# Patient Record
Sex: Female | Born: 1945
Health system: Southern US, Community
[De-identification: ages and names within clinical notes are randomized; demographics above are authoritative.]

## PROBLEM LIST (undated history)

## (undated) DIAGNOSIS — Z95810 Presence of automatic (implantable) cardiac defibrillator: Secondary | ICD-10-CM

## (undated) DIAGNOSIS — I219 Acute myocardial infarction, unspecified: Secondary | ICD-10-CM

## (undated) DIAGNOSIS — I2510 Atherosclerotic heart disease of native coronary artery without angina pectoris: Secondary | ICD-10-CM

## (undated) DIAGNOSIS — Z950 Presence of cardiac pacemaker: Secondary | ICD-10-CM

## (undated) DIAGNOSIS — I10 Essential (primary) hypertension: Secondary | ICD-10-CM

## (undated) DIAGNOSIS — E7800 Pure hypercholesterolemia, unspecified: Secondary | ICD-10-CM

## (undated) HISTORY — PX: KIDNEY STONE SURGERY: SHX686

## (undated) HISTORY — PX: ABDOMINAL HYSTERECTOMY: SHX81

## (undated) HISTORY — PX: TUBAL LIGATION: SHX77

## (undated) HISTORY — PX: CHOLECYSTECTOMY: SHX55

## (undated) HISTORY — PX: OTHER SURGICAL HISTORY: SHX169

---

## 2005-05-01 ENCOUNTER — Ambulatory Visit: Payer: Self-pay | Admitting: Family Medicine

## 2006-07-15 ENCOUNTER — Ambulatory Visit: Payer: Self-pay | Admitting: Specialist

## 2007-06-09 ENCOUNTER — Ambulatory Visit: Payer: Self-pay | Admitting: Physician Assistant

## 2007-06-14 ENCOUNTER — Ambulatory Visit: Payer: Self-pay | Admitting: Gastroenterology

## 2007-06-15 ENCOUNTER — Ambulatory Visit: Payer: Self-pay | Admitting: Family Medicine

## 2007-11-30 ENCOUNTER — Ambulatory Visit: Payer: Self-pay | Admitting: Physician Assistant

## 2007-12-06 ENCOUNTER — Ambulatory Visit: Payer: Self-pay | Admitting: General Surgery

## 2008-06-12 ENCOUNTER — Ambulatory Visit: Payer: Self-pay | Admitting: General Surgery

## 2009-06-26 ENCOUNTER — Ambulatory Visit: Payer: Self-pay | Admitting: General Surgery

## 2009-12-04 ENCOUNTER — Ambulatory Visit: Payer: Self-pay | Admitting: Physician Assistant

## 2010-01-10 ENCOUNTER — Ambulatory Visit: Payer: Self-pay | Admitting: General Surgery

## 2010-07-10 ENCOUNTER — Ambulatory Visit: Payer: Self-pay | Admitting: General Surgery

## 2012-04-15 ENCOUNTER — Ambulatory Visit: Payer: Self-pay | Admitting: Physician Assistant

## 2012-04-19 ENCOUNTER — Ambulatory Visit: Payer: Self-pay | Admitting: Physician Assistant

## 2013-11-30 ENCOUNTER — Ambulatory Visit: Payer: Self-pay | Admitting: Physician Assistant

## 2014-10-19 ENCOUNTER — Other Ambulatory Visit: Payer: Self-pay | Admitting: Physician Assistant

## 2014-10-19 DIAGNOSIS — M8580 Other specified disorders of bone density and structure, unspecified site: Secondary | ICD-10-CM

## 2015-05-01 ENCOUNTER — Other Ambulatory Visit: Payer: Self-pay | Admitting: Physician Assistant

## 2015-05-01 DIAGNOSIS — Z1239 Encounter for other screening for malignant neoplasm of breast: Secondary | ICD-10-CM

## 2015-05-08 DIAGNOSIS — E876 Hypokalemia: Secondary | ICD-10-CM | POA: Insufficient documentation

## 2015-05-08 DIAGNOSIS — I2510 Atherosclerotic heart disease of native coronary artery without angina pectoris: Secondary | ICD-10-CM | POA: Insufficient documentation

## 2015-05-08 DIAGNOSIS — Z95810 Presence of automatic (implantable) cardiac defibrillator: Secondary | ICD-10-CM | POA: Insufficient documentation

## 2015-05-08 DIAGNOSIS — Z8674 Personal history of sudden cardiac arrest: Secondary | ICD-10-CM | POA: Insufficient documentation

## 2015-10-30 ENCOUNTER — Other Ambulatory Visit: Payer: Self-pay | Admitting: Physician Assistant

## 2015-10-30 DIAGNOSIS — E7800 Pure hypercholesterolemia, unspecified: Secondary | ICD-10-CM | POA: Diagnosis not present

## 2015-10-30 DIAGNOSIS — E119 Type 2 diabetes mellitus without complications: Secondary | ICD-10-CM | POA: Diagnosis not present

## 2015-10-30 DIAGNOSIS — I2510 Atherosclerotic heart disease of native coronary artery without angina pectoris: Secondary | ICD-10-CM | POA: Diagnosis not present

## 2015-10-30 DIAGNOSIS — Z1231 Encounter for screening mammogram for malignant neoplasm of breast: Secondary | ICD-10-CM

## 2015-10-30 DIAGNOSIS — I10 Essential (primary) hypertension: Secondary | ICD-10-CM | POA: Diagnosis not present

## 2015-11-09 ENCOUNTER — Ambulatory Visit: Payer: Self-pay

## 2015-11-20 DIAGNOSIS — E785 Hyperlipidemia, unspecified: Secondary | ICD-10-CM | POA: Diagnosis not present

## 2015-11-20 DIAGNOSIS — Z95810 Presence of automatic (implantable) cardiac defibrillator: Secondary | ICD-10-CM | POA: Diagnosis not present

## 2015-11-20 DIAGNOSIS — Z8674 Personal history of sudden cardiac arrest: Secondary | ICD-10-CM | POA: Diagnosis not present

## 2015-11-20 DIAGNOSIS — I2510 Atherosclerotic heart disease of native coronary artery without angina pectoris: Secondary | ICD-10-CM | POA: Diagnosis not present

## 2015-11-23 ENCOUNTER — Ambulatory Visit
Admission: RE | Admit: 2015-11-23 | Discharge: 2015-11-23 | Disposition: A | Discharge status: Home / Self Care | Payer: BLUE CROSS/BLUE SHIELD | Source: Ambulatory Visit | Attending: Physician Assistant | Admitting: Physician Assistant

## 2015-11-23 DIAGNOSIS — Z1231 Encounter for screening mammogram for malignant neoplasm of breast: Secondary | ICD-10-CM | POA: Diagnosis not present

## 2015-11-26 DIAGNOSIS — Z8674 Personal history of sudden cardiac arrest: Secondary | ICD-10-CM | POA: Diagnosis not present

## 2015-12-24 DIAGNOSIS — N390 Urinary tract infection, site not specified: Secondary | ICD-10-CM | POA: Diagnosis not present

## 2016-01-11 DIAGNOSIS — N390 Urinary tract infection, site not specified: Secondary | ICD-10-CM | POA: Diagnosis not present

## 2016-01-11 DIAGNOSIS — Z6831 Body mass index (BMI) 31.0-31.9, adult: Secondary | ICD-10-CM | POA: Diagnosis not present

## 2016-01-28 DIAGNOSIS — E669 Obesity, unspecified: Secondary | ICD-10-CM | POA: Diagnosis not present

## 2016-01-28 DIAGNOSIS — Z6831 Body mass index (BMI) 31.0-31.9, adult: Secondary | ICD-10-CM | POA: Diagnosis not present

## 2016-01-28 DIAGNOSIS — N390 Urinary tract infection, site not specified: Secondary | ICD-10-CM | POA: Diagnosis not present

## 2016-02-11 DIAGNOSIS — R300 Dysuria: Secondary | ICD-10-CM | POA: Diagnosis not present

## 2016-02-22 DIAGNOSIS — Z8674 Personal history of sudden cardiac arrest: Secondary | ICD-10-CM | POA: Diagnosis not present

## 2016-02-22 DIAGNOSIS — E663 Overweight: Secondary | ICD-10-CM | POA: Insufficient documentation

## 2016-02-22 DIAGNOSIS — E785 Hyperlipidemia, unspecified: Secondary | ICD-10-CM | POA: Diagnosis not present

## 2016-02-22 DIAGNOSIS — Z95810 Presence of automatic (implantable) cardiac defibrillator: Secondary | ICD-10-CM | POA: Diagnosis not present

## 2016-02-22 DIAGNOSIS — I2510 Atherosclerotic heart disease of native coronary artery without angina pectoris: Secondary | ICD-10-CM | POA: Diagnosis not present

## 2016-02-25 DIAGNOSIS — Z95810 Presence of automatic (implantable) cardiac defibrillator: Secondary | ICD-10-CM | POA: Diagnosis not present

## 2016-02-25 DIAGNOSIS — Z4502 Encounter for adjustment and management of automatic implantable cardiac defibrillator: Secondary | ICD-10-CM | POA: Diagnosis not present

## 2016-05-06 DIAGNOSIS — Z23 Encounter for immunization: Secondary | ICD-10-CM | POA: Diagnosis not present

## 2016-05-06 DIAGNOSIS — I2510 Atherosclerotic heart disease of native coronary artery without angina pectoris: Secondary | ICD-10-CM | POA: Diagnosis not present

## 2016-05-06 DIAGNOSIS — Z0000 Encounter for general adult medical examination without abnormal findings: Secondary | ICD-10-CM | POA: Diagnosis not present

## 2016-05-06 DIAGNOSIS — Z9181 History of falling: Secondary | ICD-10-CM | POA: Diagnosis not present

## 2016-05-06 DIAGNOSIS — I472 Ventricular tachycardia: Secondary | ICD-10-CM | POA: Diagnosis not present

## 2016-05-06 DIAGNOSIS — E119 Type 2 diabetes mellitus without complications: Secondary | ICD-10-CM | POA: Diagnosis not present

## 2016-05-06 DIAGNOSIS — Z1389 Encounter for screening for other disorder: Secondary | ICD-10-CM | POA: Diagnosis not present

## 2016-06-04 DIAGNOSIS — Z8674 Personal history of sudden cardiac arrest: Secondary | ICD-10-CM | POA: Diagnosis not present

## 2016-06-27 ENCOUNTER — Other Ambulatory Visit: Payer: Self-pay | Admitting: Gastroenterology

## 2016-06-27 DIAGNOSIS — R1310 Dysphagia, unspecified: Secondary | ICD-10-CM

## 2016-06-27 DIAGNOSIS — Z86010 Personal history of colonic polyps: Secondary | ICD-10-CM | POA: Diagnosis not present

## 2016-06-27 DIAGNOSIS — I10 Essential (primary) hypertension: Secondary | ICD-10-CM | POA: Diagnosis not present

## 2016-06-27 DIAGNOSIS — K219 Gastro-esophageal reflux disease without esophagitis: Secondary | ICD-10-CM | POA: Diagnosis not present

## 2016-07-02 ENCOUNTER — Ambulatory Visit
Admission: RE | Admit: 2016-07-02 | Discharge: 2016-07-02 | Disposition: A | Discharge status: Home / Self Care | Payer: BLUE CROSS/BLUE SHIELD | Source: Ambulatory Visit | Attending: Gastroenterology | Admitting: Gastroenterology

## 2016-07-02 DIAGNOSIS — K228 Other specified diseases of esophagus: Secondary | ICD-10-CM | POA: Insufficient documentation

## 2016-07-02 DIAGNOSIS — K449 Diaphragmatic hernia without obstruction or gangrene: Secondary | ICD-10-CM | POA: Diagnosis not present

## 2016-07-02 DIAGNOSIS — K219 Gastro-esophageal reflux disease without esophagitis: Secondary | ICD-10-CM | POA: Insufficient documentation

## 2016-07-02 DIAGNOSIS — R1310 Dysphagia, unspecified: Secondary | ICD-10-CM | POA: Diagnosis not present

## 2016-07-11 DIAGNOSIS — I10 Essential (primary) hypertension: Secondary | ICD-10-CM | POA: Diagnosis not present

## 2016-07-23 DIAGNOSIS — I10 Essential (primary) hypertension: Secondary | ICD-10-CM | POA: Diagnosis not present

## 2016-07-23 DIAGNOSIS — Z6830 Body mass index (BMI) 30.0-30.9, adult: Secondary | ICD-10-CM | POA: Diagnosis not present

## 2016-07-23 DIAGNOSIS — Z9181 History of falling: Secondary | ICD-10-CM | POA: Diagnosis not present

## 2016-07-31 DIAGNOSIS — Z95810 Presence of automatic (implantable) cardiac defibrillator: Secondary | ICD-10-CM | POA: Diagnosis not present

## 2016-08-22 DIAGNOSIS — Z95810 Presence of automatic (implantable) cardiac defibrillator: Secondary | ICD-10-CM | POA: Diagnosis not present

## 2016-08-22 DIAGNOSIS — E785 Hyperlipidemia, unspecified: Secondary | ICD-10-CM | POA: Diagnosis not present

## 2016-08-22 DIAGNOSIS — I2510 Atherosclerotic heart disease of native coronary artery without angina pectoris: Secondary | ICD-10-CM | POA: Diagnosis not present

## 2016-08-22 DIAGNOSIS — Z8674 Personal history of sudden cardiac arrest: Secondary | ICD-10-CM | POA: Diagnosis not present

## 2016-10-13 ENCOUNTER — Encounter: Payer: Self-pay | Admitting: *Deleted

## 2016-10-14 ENCOUNTER — Ambulatory Visit
Admission: RE | Admit: 2016-10-14 | Discharge: 2016-10-14 | Disposition: A | Discharge status: Home / Self Care | Payer: BLUE CROSS/BLUE SHIELD | Source: Ambulatory Visit | Attending: Gastroenterology | Admitting: Gastroenterology

## 2016-10-14 ENCOUNTER — Ambulatory Visit: Payer: BLUE CROSS/BLUE SHIELD | Admitting: Anesthesiology

## 2016-10-14 ENCOUNTER — Encounter
Admission: RE | Disposition: A | Discharge status: Home / Self Care | Payer: Self-pay | Source: Ambulatory Visit | Attending: Gastroenterology

## 2016-10-14 DIAGNOSIS — D123 Benign neoplasm of transverse colon: Secondary | ICD-10-CM | POA: Insufficient documentation

## 2016-10-14 DIAGNOSIS — K259 Gastric ulcer, unspecified as acute or chronic, without hemorrhage or perforation: Secondary | ICD-10-CM | POA: Insufficient documentation

## 2016-10-14 DIAGNOSIS — K6389 Other specified diseases of intestine: Secondary | ICD-10-CM | POA: Insufficient documentation

## 2016-10-14 DIAGNOSIS — D122 Benign neoplasm of ascending colon: Secondary | ICD-10-CM | POA: Diagnosis not present

## 2016-10-14 DIAGNOSIS — K319 Disease of stomach and duodenum, unspecified: Secondary | ICD-10-CM | POA: Insufficient documentation

## 2016-10-14 DIAGNOSIS — K224 Dyskinesia of esophagus: Secondary | ICD-10-CM | POA: Diagnosis not present

## 2016-10-14 DIAGNOSIS — K644 Residual hemorrhoidal skin tags: Secondary | ICD-10-CM | POA: Diagnosis not present

## 2016-10-14 DIAGNOSIS — K529 Noninfective gastroenteritis and colitis, unspecified: Secondary | ICD-10-CM | POA: Diagnosis not present

## 2016-10-14 DIAGNOSIS — Z86010 Personal history of colonic polyps: Secondary | ICD-10-CM | POA: Diagnosis not present

## 2016-10-14 DIAGNOSIS — Z7982 Long term (current) use of aspirin: Secondary | ICD-10-CM | POA: Insufficient documentation

## 2016-10-14 DIAGNOSIS — K3189 Other diseases of stomach and duodenum: Secondary | ICD-10-CM | POA: Diagnosis not present

## 2016-10-14 DIAGNOSIS — I252 Old myocardial infarction: Secondary | ICD-10-CM | POA: Insufficient documentation

## 2016-10-14 DIAGNOSIS — K449 Diaphragmatic hernia without obstruction or gangrene: Secondary | ICD-10-CM | POA: Insufficient documentation

## 2016-10-14 DIAGNOSIS — K2950 Unspecified chronic gastritis without bleeding: Secondary | ICD-10-CM | POA: Diagnosis not present

## 2016-10-14 DIAGNOSIS — K2210 Ulcer of esophagus without bleeding: Secondary | ICD-10-CM | POA: Diagnosis not present

## 2016-10-14 DIAGNOSIS — I2510 Atherosclerotic heart disease of native coronary artery without angina pectoris: Secondary | ICD-10-CM | POA: Insufficient documentation

## 2016-10-14 DIAGNOSIS — Z79899 Other long term (current) drug therapy: Secondary | ICD-10-CM | POA: Diagnosis not present

## 2016-10-14 DIAGNOSIS — R1310 Dysphagia, unspecified: Secondary | ICD-10-CM | POA: Insufficient documentation

## 2016-10-14 DIAGNOSIS — E7800 Pure hypercholesterolemia, unspecified: Secondary | ICD-10-CM | POA: Diagnosis not present

## 2016-10-14 DIAGNOSIS — K210 Gastro-esophageal reflux disease with esophagitis: Secondary | ICD-10-CM | POA: Insufficient documentation

## 2016-10-14 DIAGNOSIS — I10 Essential (primary) hypertension: Secondary | ICD-10-CM | POA: Insufficient documentation

## 2016-10-14 DIAGNOSIS — K222 Esophageal obstruction: Secondary | ICD-10-CM | POA: Insufficient documentation

## 2016-10-14 DIAGNOSIS — D124 Benign neoplasm of descending colon: Secondary | ICD-10-CM | POA: Insufficient documentation

## 2016-10-14 DIAGNOSIS — Z95810 Presence of automatic (implantable) cardiac defibrillator: Secondary | ICD-10-CM | POA: Diagnosis not present

## 2016-10-14 DIAGNOSIS — K2960 Other gastritis without bleeding: Secondary | ICD-10-CM | POA: Diagnosis not present

## 2016-10-14 DIAGNOSIS — K641 Second degree hemorrhoids: Secondary | ICD-10-CM | POA: Insufficient documentation

## 2016-10-14 DIAGNOSIS — K648 Other hemorrhoids: Secondary | ICD-10-CM | POA: Diagnosis not present

## 2016-10-14 HISTORY — DX: Presence of automatic (implantable) cardiac defibrillator: Z95.810

## 2016-10-14 HISTORY — DX: Presence of cardiac pacemaker: Z95.0

## 2016-10-14 HISTORY — DX: Acute myocardial infarction, unspecified: I21.9

## 2016-10-14 HISTORY — PX: COLONOSCOPY WITH PROPOFOL: SHX5780

## 2016-10-14 HISTORY — DX: Atherosclerotic heart disease of native coronary artery without angina pectoris: I25.10

## 2016-10-14 HISTORY — DX: Pure hypercholesterolemia, unspecified: E78.00

## 2016-10-14 HISTORY — DX: Essential (primary) hypertension: I10

## 2016-10-14 HISTORY — PX: ESOPHAGOGASTRODUODENOSCOPY (EGD) WITH PROPOFOL: SHX5813

## 2016-10-14 SURGERY — COLONOSCOPY WITH PROPOFOL
Anesthesia: General

## 2016-10-14 MED ORDER — PROPOFOL 500 MG/50ML IV EMUL
INTRAVENOUS | Status: DC | PRN
  Administered 2016-10-14: 150 ug/kg/min via INTRAVENOUS

## 2016-10-14 MED ORDER — PHENYLEPHRINE HCL 10 MG/ML IJ SOLN
INTRAMUSCULAR | Status: DC | PRN
  Administered 2016-10-14: 50 ug via INTRAVENOUS
  Administered 2016-10-14: 100 ug via INTRAVENOUS

## 2016-10-14 MED ORDER — MIDAZOLAM HCL 2 MG/2ML IJ SOLN
INTRAMUSCULAR | Status: AC
  Filled 2016-10-14: qty 2

## 2016-10-14 MED ORDER — FENTANYL CITRATE (PF) 100 MCG/2ML IJ SOLN
INTRAMUSCULAR | Status: AC
  Filled 2016-10-14: qty 2

## 2016-10-14 MED ORDER — LIDOCAINE HCL (CARDIAC) 20 MG/ML IV SOLN
INTRAVENOUS | Status: DC | PRN
  Administered 2016-10-14: 100 mg via INTRAVENOUS

## 2016-10-14 MED ORDER — MIDAZOLAM HCL 2 MG/2ML IJ SOLN
INTRAMUSCULAR | Status: DC | PRN
  Administered 2016-10-14: 0.5 mg via INTRAVENOUS

## 2016-10-14 MED ORDER — FENTANYL CITRATE (PF) 100 MCG/2ML IJ SOLN
INTRAMUSCULAR | Status: DC | PRN
  Administered 2016-10-14: 50 ug via INTRAVENOUS

## 2016-10-14 MED ORDER — PROPOFOL 10 MG/ML IV BOLUS
INTRAVENOUS | Status: DC | PRN
  Administered 2016-10-14: 40 mg via INTRAVENOUS
  Administered 2016-10-14 (×3): 20 mg via INTRAVENOUS
  Administered 2016-10-14: 10 mg via INTRAVENOUS
  Administered 2016-10-14: 20 mg via INTRAVENOUS

## 2016-10-14 MED ORDER — SODIUM CHLORIDE 0.9 % IV SOLN
INTRAVENOUS | Status: DC
  Administered 2016-10-14: 08:00:00 via INTRAVENOUS

## 2016-10-14 MED ORDER — SODIUM CHLORIDE 0.9 % IV SOLN: INTRAVENOUS | Status: DC

## 2016-10-14 MED ORDER — EPHEDRINE SULFATE 50 MG/ML IJ SOLN
INTRAMUSCULAR | Status: DC | PRN
  Administered 2016-10-14 (×2): 10 mg via INTRAVENOUS

## 2016-10-14 MED ORDER — PROPOFOL 10 MG/ML IV BOLUS
INTRAVENOUS | Status: AC
  Filled 2016-10-14: qty 20

## 2016-10-14 NOTE — Anesthesia Preprocedure Evaluation (Signed)
Anesthesia Evaluation  Patient identified by MRN, date of birth, ID band Patient awake    Reviewed: Allergy & Precautions, H&P , NPO status , Patient's Chart, lab work & pertinent test results, reviewed documented beta blocker date and time   Airway Mallampati: II   Neck ROM: full    Dental  (+) Poor Dentition   Pulmonary neg pulmonary ROS,    Pulmonary exam normal        Cardiovascular hypertension, + CAD  negative cardio ROS Normal cardiovascular exam Rhythm:regular Rate:Normal     Neuro/Psych negative neurological ROS  negative psych ROS   GI/Hepatic negative GI ROS, Neg liver ROS,   Endo/Other  negative endocrine ROS  Renal/GU negative Renal ROS  negative genitourinary   Musculoskeletal   Abdominal   Peds  Hematology negative hematology ROS (+)   Anesthesia Other Findings Past Medical History: No date: Coronary artery disease No date: Hypercholesteremia No date: Hypertension Past Surgical History: No date: ABDOMINAL HYSTERECTOMY No date: CHOLECYSTECTOMY No date: GOITER SURGERY No date: INSERTION PACEMAKER/DEFIB No date: KIDNEY STONE SURGERY   Reproductive/Obstetrics negative OB ROS                             Anesthesia Physical Anesthesia Plan  ASA: III  Anesthesia Plan: General   Post-op Pain Management:    Induction:   Airway Management Planned:   Additional Equipment:   Intra-op Plan:   Post-operative Plan:   Informed Consent: I have reviewed the patients History and Physical, chart, labs and discussed the procedure including the risks, benefits and alternatives for the proposed anesthesia with the patient or authorized representative who has indicated his/her understanding and acceptance.   Dental Advisory Given  Plan Discussed with: CRNA  Anesthesia Plan Comments:         Anesthesia Quick Evaluation

## 2016-10-14 NOTE — Op Note (Signed)
[facility] Gastroenterology Patient Name: Melissa Kennedy Procedure Date: 10/14/2016 7:05 AM MRN: [id number] Account #: [id number] Date of Birth: 06/12/1945 Admit Type: Outpatient Age: 71 Room: [facility] ENDO ROOM 3 Gender: Female Note Status: Finalized Procedure:            Upper GI endoscopy Indications:          Dysphagia, Gastro-esophageal reflux disease Providers:            Lollie Sails, MD Referring MD:         Cyndi Bender (Referring MD) Medicines:            Monitored Anesthesia Care Complications:        No immediate complications. Procedure:            Pre-Anesthesia Assessment:                       - ASA Grade Assessment: III - A patient with severe                        systemic disease.                       After obtaining informed consent, the endoscope was                        passed under direct vision. Throughout the procedure,                        the patient's blood pressure, pulse, and oxygen                        saturations were monitored continuously. The Endoscope                        was introduced through the mouth, and advanced to the                        third part of duodenum. The upper GI endoscopy was                        accomplished without difficulty. The patient tolerated                        the procedure well. The patient tolerated the procedure                        well. Findings:      Abnormal motility was noted in the middle third of the esophagus and in       the lower third of the esophagus. The cricopharyngeus was normal. There       are extra peristaltic waves in the esophageal body. The distal       esophagus/lower esophageal sphincter is spastic, but gives up passage to       the endoscope. Tertiary peristaltic waves are noted.      A low-grade of narrowing Schatzki ring (acquired) was found at the       gastroesophageal junction. A TTS dilator was passed through the scope.       Dilation with a  15-16.5-18 mm balloon dilator was performed to 16.5 mm       with opening  of the ring noted. Biopsies were taken with a cold forceps       for histology.      LA Grade C (one or more mucosal breaks continuous between tops of 2 or       more mucosal folds, less than 75% circumference) esophagitis with no       bleeding was found.      Diffuse and patchy moderate inflammation characterized by adherent       blood, congestion (edema), erosions, erythema and friability was found       in the entire examined stomach. Biopsies were taken with a cold forceps       for histology. Biopsies were taken with a cold forceps for Helicobacter       pylori testing.      The cardia and gastric fundus were normal on retroflexion otherwise.      Localized granular mucosa was found at the incisura. Biopsies were taken       with a cold forceps for histology.      A small hiatal hernia with a single Cameron ulcer was found. The Z-line       was a variable distance from incisors; the hiatal hernia was sliding. Impression:           - Abnormal esophageal motility, consistent with                        presbyesophagus.                       - Low-grade of narrowing Schatzki ring. Dilated.                        Biopsied.                       - LA Grade C erosive esophagitis.                       - Bile gastritis. Biopsied.                       - Small hiatal hernia. Recommendation:       - Use Aciphex (rabeprazole) 20 mg PO daily daily.                       - Use sucralfate tablets 1 gram PO QID for 1 month.                       - Clear liquid diet today.                       - Full liquid diet for 1 day, then advance as tolerated                        to soft diet for 2 days.                       - Await pathology results. Procedure Code(s):    --- Professional ---                       [phone], Esophagogastroduodenoscopy, flexible, transoral;  with transendoscopic balloon  dilation of esophagus                        (less than 30 mm diameter)                       [id number], Esophagogastroduodenoscopy, flexible, transoral;                        with biopsy, single or multiple Diagnosis Code(s):    --- Professional ---                       K22.4, Dyskinesia of esophagus                       K22.2, Esophageal obstruction                       K20.8, Other esophagitis                       K29.60, Other gastritis without bleeding                       K44.9, Diaphragmatic hernia without obstruction or                        gangrene                       R13.10, Dysphagia, unspecified                       K21.9, Gastro-esophageal reflux disease without                        esophagitis CPT copyright 2016 American [employer]. All rights reserved. The codes documented in this report are preliminary and upon coder review may  be revised to meet current compliance requirements. Lollie Sails, MD 10/14/2016 8:33:53 AM This report has been signed electronically. Number of Addenda: 0 Note Initiated On: 10/14/2016 7:05 AM      [facility]

## 2016-10-14 NOTE — Op Note (Signed)
[facility] Gastroenterology Patient Name: Melissa Kennedy Procedure Date: 10/14/2016 7:05 AM MRN: [id number] Account #: [id number] Date of Birth: Oct 09, 1945 Admit Type: Outpatient Age: 71 Room: [facility] ENDO ROOM 3 Gender: Female Note Status: Finalized Procedure:            Colonoscopy Indications:          Personal history of colonic polyps Providers:            Lollie Sails, MD Medicines:            Monitored Anesthesia Care Complications:        No immediate complications. Procedure:            Pre-Anesthesia Assessment:                       - ASA Grade Assessment: III - A patient with severe                        systemic disease.                       After obtaining informed consent, the colonoscope was                        passed under direct vision. Throughout the procedure,                        the patient's blood pressure, pulse, and oxygen                        saturations were monitored continuously. The                        Colonoscope was introduced through the anus and                        advanced to the the cecum, identified by appendiceal                        orifice and ileocecal valve. The colonoscopy was                        performed with moderate difficulty. Successful                        completion of the procedure was aided by changing the                        patient to a supine position. The quality of the bowel                        preparation was good except the sigmoid colon was poor. Findings:      A patchy area of mildly eroded and inflamed mucosa was found in the       distal descending colon. Biopsies were taken with a cold forceps for       histology, rule out ischemic colitis..      Biopsies for histology were taken with a cold forceps from the right       colon and left colon for evaluation of microscopic colitis.      A 3 mm polyp  was found in the proximal descending colon. The polyp was       sessile.  The polyp was removed with a cold biopsy forceps. Resection and       retrieval were complete.      Two sessile polyps were found in the hepatic flexure. The polyps were 2       to 3 mm in size. These polyps were removed with a cold biopsy forceps.       Resection and retrieval were complete.      A 1 mm polyp was found in the ascending colon. The polyp was sessile.       The polyp was removed with a cold biopsy forceps. Resection and       retrieval were complete.      Non-bleeding internal hemorrhoids were found during retroflexion, during       digital exam and during anoscopy. The hemorrhoids were small and Grade       II (internal hemorrhoids that prolapse but reduce spontaneously).      No additional abnormalities were found on retroflexion. Impression:           - Eroded and inflamed mucosa in the distal descending                        colon. Biopsied.                       - One 3 mm polyp in the proximal descending colon,                        removed with a cold biopsy forceps. Resected and                        retrieved.                       - Two 2 to 3 mm polyps at the hepatic flexure, removed                        with a cold biopsy forceps. Resected and retrieved.                       - One 1 mm polyp in the ascending colon, removed with a                        cold biopsy forceps. Resected and retrieved.                       - Non-bleeding internal hemorrhoids.                       - Biopsies were taken with a cold forceps from the                        right colon and left colon for evaluation of                        microscopic colitis. Recommendation:       - Await pathology results.                       - Use Analpram HC Cream  2.5%: Apply externally TID for                        10 days. Procedure Code(s):    --- Professional ---                       [phone], Colonoscopy, flexible; with biopsy, single or                        multiple Diagnosis  Code(s):    --- Professional ---                       K63.89, Other specified diseases of intestine                       K52.9, Noninfective gastroenteritis and colitis,                        unspecified                       D12.4, Benign neoplasm of descending colon                       D12.2, Benign neoplasm of ascending colon                       D12.3, Benign neoplasm of transverse colon (hepatic                        flexure or splenic flexure)                       K64.1, Second degree hemorrhoids                       Z86.010, Personal history of colonic polyps CPT copyright 2016 American Medical Association. All rights reserved. The codes documented in this report are preliminary and upon coder review may  be revised to meet current compliance requirements. Lollie Sails, MD 10/14/2016 9:13:56 AM This report has been signed electronically. Number of Addenda: 0 Note Initiated On: 10/14/2016 7:05 AM Scope Withdrawal Time: 0 hours 13 minutes 11 seconds  Total Procedure Duration: 0 hours 29 minutes 34 seconds       [facility]

## 2016-10-14 NOTE — Anesthesia Post-op Follow-up Note (Cosign Needed)
Anesthesia QCDR form completed.        

## 2016-10-14 NOTE — Anesthesia Procedure Notes (Signed)
Date/Time: 10/14/2016 7:52 AM Performed by: Allean Found Pre-anesthesia Checklist: Patient identified, Emergency Drugs available, Suction available, Patient being monitored and Timeout performed Patient Re-evaluated:Patient Re-evaluated prior to inductionOxygen Delivery Method: Nasal cannula Placement Confirmation: positive ETCO2

## 2016-10-14 NOTE — H&P (Signed)
Outpatient short stay form Pre-procedure 10/14/2016 7:26 AM Melissa Sails MD  Primary Physician: Cyndi Bender PA  Reason for visit:  EGD and colonoscopy  History of present illness:  Patient is a 71 year old female presenting today as above. She has a history of some dysphagia. She was previously on omeprazole but was changed to taking Zantac. This may not be as effective for her as she has daily heartburn. She had a barium swallow done which showed some mild changes of presbyesophagus and a possible narrowing toward the bottom of the esophagus. This seemed to be transient takes a daily 81 mg aspirin that has been held. She takes no blood thinning agents or other aspirin products. She tolerated her prep well for her colonoscopy. She does have a personal history of adenomatous colon polyps.    Current Facility-Administered Medications:  .  0.9 %  sodium chloride infusion, , Intravenous, Continuous, Melissa Sails, MD .  0.9 %  sodium chloride infusion, , Intravenous, Continuous, Melissa Sails, MD  Prescriptions Prior to Admission  Medication Sig Dispense Refill Last Dose  . amiodarone (PACERONE) 200 MG tablet Take 200 mg by mouth daily.   10/13/2016 at Unknown time  . aspirin EC 81 MG tablet Take 81 mg by mouth daily.   10/12/2016  . famotidine (HEARTBURN RELIEF) 10 MG tablet Take 10 mg by mouth 2 (two) times daily.   10/12/2016  . metoprolol tartrate (LOPRESSOR) 50 MG tablet Take 50 mg by mouth 2 (two) times daily.   10/12/2016  . ranitidine (ZANTAC) 150 MG tablet Take 150 mg by mouth 2 (two) times daily.   10/12/2016  . simvastatin (ZOCOR) 20 MG tablet Take 20 mg by mouth daily.   10/12/2016  . Acidophilus Lactobacillus CAPS Take by mouth.   Not Taking at Unknown time     Allergies  Allergen Reactions  . Hydrocodone-Acetaminophen Nausea Only  . Sulfa Antibiotics Other (See Comments)    UNKNOWN     Past Medical History:  Diagnosis Date  . AICD (automatic  cardioverter/defibrillator) present   . Coronary artery disease   . Hypercholesteremia   . Hypertension   . Myocardial infarction ([place])   . Presence of permanent cardiac pacemaker     Review of systems:      Physical Exam    Heart and lungs: Regular rate and rhythm without rub or gallop, lungs are bilaterally clear.    HEENT: Normocephalic atraumatic eyes are anicteric.     Other:     Pertinant exam for procedure: Soft nontender nondistended bowel sounds positive normoactive.    Planned proceedures: EGD, colonoscopy and indicated procedures. I have discussed the risks benefits and complications of procedures to include not limited to bleeding, infection, perforation and the risk of sedation and the patient wishes to proceed.    Melissa Sails, MD Gastroenterology 10/14/2016  7:26 AM

## 2016-10-14 NOTE — Transfer of Care (Signed)
Immediate Anesthesia Transfer of Care Note  Patient: Melissa Kennedy  Procedure(s) Performed: Procedure(s): COLONOSCOPY WITH PROPOFOL (N/A) ESOPHAGOGASTRODUODENOSCOPY (EGD) WITH PROPOFOL (N/A)  Patient Location: PACU  Anesthesia Type:General  Level of Consciousness: awake  Airway & Oxygen Therapy: Patient Spontanous Breathing and Patient connected to nasal cannula oxygen  Post-op Assessment: Report given to RN and Post -op Vital signs reviewed and stable  Post vital signs: Reviewed and stable  Last Vitals:  Vitals:   10/14/16 0751  BP: (!) 158/94  Pulse: 91  Resp: 18  Temp: 36.2 C    Last Pain:  Vitals:   10/14/16 0751  TempSrc: Tympanic         Complications: No apparent anesthesia complications

## 2016-10-15 ENCOUNTER — Encounter: Payer: Self-pay | Admitting: Gastroenterology

## 2016-10-15 LAB — SURGICAL PATHOLOGY

## 2016-10-15 NOTE — Anesthesia Postprocedure Evaluation (Signed)
Anesthesia Post Note  Patient: Melissa Kennedy  Procedure(s) Performed: Procedure(s) (LRB): COLONOSCOPY WITH PROPOFOL (N/A) ESOPHAGOGASTRODUODENOSCOPY (EGD) WITH PROPOFOL (N/A)  Patient location during evaluation: PACU Anesthesia Type: General Level of consciousness: awake and alert Pain management: pain level controlled Vital Signs Assessment: post-procedure vital signs reviewed and stable Respiratory status: spontaneous breathing, nonlabored ventilation, respiratory function stable and patient connected to nasal cannula oxygen Cardiovascular status: blood pressure returned to baseline and stable Postop Assessment: no signs of nausea or vomiting Anesthetic complications: no     Last Vitals:  Vitals:   10/14/16 0945 10/14/16 0955  BP: 133/76 140/67  Pulse: 77 77  Resp: 20 16  Temp:      Last Pain:  Vitals:   10/14/16 0751  TempSrc: Tympanic                 Molli Barrows

## 2016-10-18 DIAGNOSIS — H2513 Age-related nuclear cataract, bilateral: Secondary | ICD-10-CM | POA: Diagnosis not present

## 2016-10-31 DIAGNOSIS — Z95810 Presence of automatic (implantable) cardiac defibrillator: Secondary | ICD-10-CM | POA: Diagnosis not present

## 2016-11-05 DIAGNOSIS — E7800 Pure hypercholesterolemia, unspecified: Secondary | ICD-10-CM | POA: Diagnosis not present

## 2016-11-05 DIAGNOSIS — E119 Type 2 diabetes mellitus without complications: Secondary | ICD-10-CM | POA: Diagnosis not present

## 2016-11-05 DIAGNOSIS — I472 Ventricular tachycardia: Secondary | ICD-10-CM | POA: Diagnosis not present

## 2016-11-05 DIAGNOSIS — I10 Essential (primary) hypertension: Secondary | ICD-10-CM | POA: Diagnosis not present

## 2016-11-05 DIAGNOSIS — I2510 Atherosclerotic heart disease of native coronary artery without angina pectoris: Secondary | ICD-10-CM | POA: Diagnosis not present

## 2016-11-05 DIAGNOSIS — Z79899 Other long term (current) drug therapy: Secondary | ICD-10-CM | POA: Diagnosis not present

## 2016-11-20 DIAGNOSIS — K449 Diaphragmatic hernia without obstruction or gangrene: Secondary | ICD-10-CM | POA: Diagnosis not present

## 2016-11-20 DIAGNOSIS — Z86010 Personal history of colonic polyps: Secondary | ICD-10-CM | POA: Diagnosis not present

## 2016-11-20 DIAGNOSIS — R1310 Dysphagia, unspecified: Secondary | ICD-10-CM | POA: Diagnosis not present

## 2016-11-20 DIAGNOSIS — R159 Full incontinence of feces: Secondary | ICD-10-CM | POA: Diagnosis not present

## 2017-02-16 DIAGNOSIS — Z95810 Presence of automatic (implantable) cardiac defibrillator: Secondary | ICD-10-CM | POA: Diagnosis not present

## 2017-04-22 DIAGNOSIS — Z95810 Presence of automatic (implantable) cardiac defibrillator: Secondary | ICD-10-CM | POA: Diagnosis not present

## 2017-04-22 DIAGNOSIS — Z8674 Personal history of sudden cardiac arrest: Secondary | ICD-10-CM | POA: Diagnosis not present

## 2017-04-22 DIAGNOSIS — E785 Hyperlipidemia, unspecified: Secondary | ICD-10-CM | POA: Diagnosis not present

## 2017-04-22 DIAGNOSIS — I2510 Atherosclerotic heart disease of native coronary artery without angina pectoris: Secondary | ICD-10-CM | POA: Diagnosis not present

## 2017-05-01 DIAGNOSIS — Z95810 Presence of automatic (implantable) cardiac defibrillator: Secondary | ICD-10-CM | POA: Diagnosis not present

## 2017-05-28 DIAGNOSIS — Z1331 Encounter for screening for depression: Secondary | ICD-10-CM | POA: Diagnosis not present

## 2017-05-28 DIAGNOSIS — I472 Ventricular tachycardia: Secondary | ICD-10-CM | POA: Diagnosis not present

## 2017-05-28 DIAGNOSIS — Z79899 Other long term (current) drug therapy: Secondary | ICD-10-CM | POA: Diagnosis not present

## 2017-05-28 DIAGNOSIS — E119 Type 2 diabetes mellitus without complications: Secondary | ICD-10-CM | POA: Diagnosis not present

## 2017-05-28 DIAGNOSIS — I10 Essential (primary) hypertension: Secondary | ICD-10-CM | POA: Diagnosis not present

## 2017-05-28 DIAGNOSIS — Z23 Encounter for immunization: Secondary | ICD-10-CM | POA: Diagnosis not present

## 2017-05-28 DIAGNOSIS — E7800 Pure hypercholesterolemia, unspecified: Secondary | ICD-10-CM | POA: Diagnosis not present

## 2017-07-31 DIAGNOSIS — Z95810 Presence of automatic (implantable) cardiac defibrillator: Secondary | ICD-10-CM | POA: Diagnosis not present

## 2017-08-31 DIAGNOSIS — E119 Type 2 diabetes mellitus without complications: Secondary | ICD-10-CM | POA: Diagnosis not present

## 2017-08-31 DIAGNOSIS — E7800 Pure hypercholesterolemia, unspecified: Secondary | ICD-10-CM | POA: Diagnosis not present

## 2017-08-31 DIAGNOSIS — I10 Essential (primary) hypertension: Secondary | ICD-10-CM | POA: Diagnosis not present

## 2017-08-31 DIAGNOSIS — Z9181 History of falling: Secondary | ICD-10-CM | POA: Diagnosis not present

## 2017-08-31 DIAGNOSIS — K580 Irritable bowel syndrome with diarrhea: Secondary | ICD-10-CM | POA: Diagnosis not present

## 2017-08-31 DIAGNOSIS — Z23 Encounter for immunization: Secondary | ICD-10-CM | POA: Diagnosis not present

## 2017-09-02 ENCOUNTER — Other Ambulatory Visit: Payer: Self-pay | Admitting: Physician Assistant

## 2017-09-02 DIAGNOSIS — Z1231 Encounter for screening mammogram for malignant neoplasm of breast: Secondary | ICD-10-CM

## 2017-09-02 DIAGNOSIS — M8580 Other specified disorders of bone density and structure, unspecified site: Secondary | ICD-10-CM

## 2017-09-30 ENCOUNTER — Other Ambulatory Visit: Payer: BLUE CROSS/BLUE SHIELD

## 2017-10-28 ENCOUNTER — Ambulatory Visit
Admission: RE | Admit: 2017-10-28 | Discharge: 2017-10-28 | Disposition: A | Discharge status: Home / Self Care | Payer: BLUE CROSS/BLUE SHIELD | Source: Ambulatory Visit | Attending: Physician Assistant | Admitting: Physician Assistant

## 2017-10-28 DIAGNOSIS — M810 Age-related osteoporosis without current pathological fracture: Secondary | ICD-10-CM | POA: Diagnosis not present

## 2017-10-28 DIAGNOSIS — Z1231 Encounter for screening mammogram for malignant neoplasm of breast: Secondary | ICD-10-CM | POA: Insufficient documentation

## 2017-10-28 DIAGNOSIS — Z780 Asymptomatic menopausal state: Secondary | ICD-10-CM | POA: Diagnosis not present

## 2017-10-28 DIAGNOSIS — M8580 Other specified disorders of bone density and structure, unspecified site: Secondary | ICD-10-CM | POA: Diagnosis not present

## 2017-10-30 DIAGNOSIS — Z95810 Presence of automatic (implantable) cardiac defibrillator: Secondary | ICD-10-CM | POA: Diagnosis not present

## 2017-11-26 DIAGNOSIS — N390 Urinary tract infection, site not specified: Secondary | ICD-10-CM | POA: Diagnosis not present

## 2017-11-26 DIAGNOSIS — E119 Type 2 diabetes mellitus without complications: Secondary | ICD-10-CM | POA: Diagnosis not present

## 2017-11-26 DIAGNOSIS — I10 Essential (primary) hypertension: Secondary | ICD-10-CM | POA: Diagnosis not present

## 2017-11-26 DIAGNOSIS — I2510 Atherosclerotic heart disease of native coronary artery without angina pectoris: Secondary | ICD-10-CM | POA: Diagnosis not present

## 2017-11-26 DIAGNOSIS — Z79899 Other long term (current) drug therapy: Secondary | ICD-10-CM | POA: Diagnosis not present

## 2017-11-26 DIAGNOSIS — I472 Ventricular tachycardia: Secondary | ICD-10-CM | POA: Diagnosis not present

## 2017-11-26 DIAGNOSIS — E7800 Pure hypercholesterolemia, unspecified: Secondary | ICD-10-CM | POA: Diagnosis not present

## 2017-12-08 DIAGNOSIS — I2510 Atherosclerotic heart disease of native coronary artery without angina pectoris: Secondary | ICD-10-CM | POA: Diagnosis not present

## 2017-12-08 DIAGNOSIS — Z79899 Other long term (current) drug therapy: Secondary | ICD-10-CM | POA: Diagnosis not present

## 2017-12-08 DIAGNOSIS — Z7984 Long term (current) use of oral hypoglycemic drugs: Secondary | ICD-10-CM | POA: Diagnosis not present

## 2017-12-08 DIAGNOSIS — R609 Edema, unspecified: Secondary | ICD-10-CM | POA: Diagnosis not present

## 2017-12-08 DIAGNOSIS — E119 Type 2 diabetes mellitus without complications: Secondary | ICD-10-CM | POA: Diagnosis not present

## 2017-12-08 DIAGNOSIS — Z8674 Personal history of sudden cardiac arrest: Secondary | ICD-10-CM | POA: Diagnosis not present

## 2017-12-08 DIAGNOSIS — Z5181 Encounter for therapeutic drug level monitoring: Secondary | ICD-10-CM | POA: Diagnosis not present

## 2017-12-08 DIAGNOSIS — E785 Hyperlipidemia, unspecified: Secondary | ICD-10-CM | POA: Diagnosis not present

## 2017-12-08 DIAGNOSIS — Z95810 Presence of automatic (implantable) cardiac defibrillator: Secondary | ICD-10-CM | POA: Diagnosis not present

## 2018-01-07 DIAGNOSIS — I082 Rheumatic disorders of both aortic and tricuspid valves: Secondary | ICD-10-CM | POA: Diagnosis not present

## 2018-01-29 DIAGNOSIS — Z95810 Presence of automatic (implantable) cardiac defibrillator: Secondary | ICD-10-CM | POA: Diagnosis not present

## 2018-03-09 DIAGNOSIS — E119 Type 2 diabetes mellitus without complications: Secondary | ICD-10-CM | POA: Diagnosis not present

## 2018-03-09 DIAGNOSIS — E785 Hyperlipidemia, unspecified: Secondary | ICD-10-CM | POA: Diagnosis not present

## 2018-03-09 DIAGNOSIS — Z794 Long term (current) use of insulin: Secondary | ICD-10-CM | POA: Diagnosis not present

## 2018-03-09 DIAGNOSIS — I2510 Atherosclerotic heart disease of native coronary artery without angina pectoris: Secondary | ICD-10-CM | POA: Diagnosis not present

## 2018-03-09 DIAGNOSIS — Z95810 Presence of automatic (implantable) cardiac defibrillator: Secondary | ICD-10-CM | POA: Diagnosis not present

## 2018-03-17 DIAGNOSIS — I2510 Atherosclerotic heart disease of native coronary artery without angina pectoris: Secondary | ICD-10-CM | POA: Diagnosis not present

## 2018-03-17 DIAGNOSIS — Z01818 Encounter for other preprocedural examination: Secondary | ICD-10-CM | POA: Diagnosis not present

## 2018-03-17 DIAGNOSIS — Z7984 Long term (current) use of oral hypoglycemic drugs: Secondary | ICD-10-CM | POA: Diagnosis not present

## 2018-03-17 DIAGNOSIS — E119 Type 2 diabetes mellitus without complications: Secondary | ICD-10-CM | POA: Diagnosis not present

## 2018-03-17 DIAGNOSIS — Z950 Presence of cardiac pacemaker: Secondary | ICD-10-CM | POA: Diagnosis not present

## 2018-03-22 DIAGNOSIS — Z8674 Personal history of sudden cardiac arrest: Secondary | ICD-10-CM | POA: Diagnosis not present

## 2018-03-22 DIAGNOSIS — I2510 Atherosclerotic heart disease of native coronary artery without angina pectoris: Secondary | ICD-10-CM | POA: Diagnosis not present

## 2018-03-22 DIAGNOSIS — Z79899 Other long term (current) drug therapy: Secondary | ICD-10-CM | POA: Diagnosis not present

## 2018-03-22 DIAGNOSIS — Z4502 Encounter for adjustment and management of automatic implantable cardiac defibrillator: Secondary | ICD-10-CM | POA: Diagnosis not present

## 2018-03-22 DIAGNOSIS — T82111A Breakdown (mechanical) of cardiac pulse generator (battery), initial encounter: Secondary | ICD-10-CM | POA: Diagnosis not present

## 2018-03-22 DIAGNOSIS — E119 Type 2 diabetes mellitus without complications: Secondary | ICD-10-CM | POA: Diagnosis not present

## 2018-03-22 DIAGNOSIS — Z7984 Long term (current) use of oral hypoglycemic drugs: Secondary | ICD-10-CM | POA: Diagnosis not present

## 2018-03-22 DIAGNOSIS — E785 Hyperlipidemia, unspecified: Secondary | ICD-10-CM | POA: Diagnosis not present

## 2018-03-23 DIAGNOSIS — Z4502 Encounter for adjustment and management of automatic implantable cardiac defibrillator: Secondary | ICD-10-CM | POA: Diagnosis not present

## 2018-03-23 DIAGNOSIS — I2510 Atherosclerotic heart disease of native coronary artery without angina pectoris: Secondary | ICD-10-CM | POA: Diagnosis not present

## 2018-03-23 DIAGNOSIS — E119 Type 2 diabetes mellitus without complications: Secondary | ICD-10-CM | POA: Diagnosis not present

## 2018-03-23 DIAGNOSIS — Z79899 Other long term (current) drug therapy: Secondary | ICD-10-CM | POA: Diagnosis not present

## 2018-03-23 DIAGNOSIS — Z7984 Long term (current) use of oral hypoglycemic drugs: Secondary | ICD-10-CM | POA: Diagnosis not present

## 2018-03-23 DIAGNOSIS — Z8674 Personal history of sudden cardiac arrest: Secondary | ICD-10-CM | POA: Diagnosis not present

## 2018-03-23 DIAGNOSIS — E785 Hyperlipidemia, unspecified: Secondary | ICD-10-CM | POA: Diagnosis not present

## 2018-03-31 DIAGNOSIS — Z95810 Presence of automatic (implantable) cardiac defibrillator: Secondary | ICD-10-CM | POA: Diagnosis not present

## 2018-06-01 DIAGNOSIS — Z79899 Other long term (current) drug therapy: Secondary | ICD-10-CM | POA: Diagnosis not present

## 2018-06-01 DIAGNOSIS — I472 Ventricular tachycardia: Secondary | ICD-10-CM | POA: Diagnosis not present

## 2018-06-01 DIAGNOSIS — I10 Essential (primary) hypertension: Secondary | ICD-10-CM | POA: Diagnosis not present

## 2018-06-01 DIAGNOSIS — E119 Type 2 diabetes mellitus without complications: Secondary | ICD-10-CM | POA: Diagnosis not present

## 2018-06-01 DIAGNOSIS — I2510 Atherosclerotic heart disease of native coronary artery without angina pectoris: Secondary | ICD-10-CM | POA: Diagnosis not present

## 2018-06-01 DIAGNOSIS — E7800 Pure hypercholesterolemia, unspecified: Secondary | ICD-10-CM | POA: Diagnosis not present

## 2018-07-01 DIAGNOSIS — Z95810 Presence of automatic (implantable) cardiac defibrillator: Secondary | ICD-10-CM | POA: Diagnosis not present

## 2018-10-14 DIAGNOSIS — Z95810 Presence of automatic (implantable) cardiac defibrillator: Secondary | ICD-10-CM | POA: Diagnosis not present

## 2018-10-14 DIAGNOSIS — Z4502 Encounter for adjustment and management of automatic implantable cardiac defibrillator: Secondary | ICD-10-CM | POA: Diagnosis not present

## 2018-10-14 DIAGNOSIS — Z8674 Personal history of sudden cardiac arrest: Secondary | ICD-10-CM | POA: Diagnosis not present

## 2018-11-25 DIAGNOSIS — Z95810 Presence of automatic (implantable) cardiac defibrillator: Secondary | ICD-10-CM | POA: Diagnosis not present

## 2018-11-30 DIAGNOSIS — E7800 Pure hypercholesterolemia, unspecified: Secondary | ICD-10-CM | POA: Diagnosis not present

## 2018-11-30 DIAGNOSIS — Z79899 Other long term (current) drug therapy: Secondary | ICD-10-CM | POA: Diagnosis not present

## 2018-11-30 DIAGNOSIS — H6122 Impacted cerumen, left ear: Secondary | ICD-10-CM | POA: Diagnosis not present

## 2018-11-30 DIAGNOSIS — Z139 Encounter for screening, unspecified: Secondary | ICD-10-CM | POA: Diagnosis not present

## 2018-11-30 DIAGNOSIS — E119 Type 2 diabetes mellitus without complications: Secondary | ICD-10-CM | POA: Diagnosis not present

## 2018-11-30 DIAGNOSIS — I472 Ventricular tachycardia: Secondary | ICD-10-CM | POA: Diagnosis not present

## 2018-11-30 DIAGNOSIS — I10 Essential (primary) hypertension: Secondary | ICD-10-CM | POA: Diagnosis not present

## 2018-11-30 DIAGNOSIS — I2510 Atherosclerotic heart disease of native coronary artery without angina pectoris: Secondary | ICD-10-CM | POA: Diagnosis not present

## 2018-11-30 DIAGNOSIS — N390 Urinary tract infection, site not specified: Secondary | ICD-10-CM | POA: Diagnosis not present

## 2018-11-30 DIAGNOSIS — Z1331 Encounter for screening for depression: Secondary | ICD-10-CM | POA: Diagnosis not present

## 2018-11-30 DIAGNOSIS — Z9181 History of falling: Secondary | ICD-10-CM | POA: Diagnosis not present

## 2019-02-24 DIAGNOSIS — Z95810 Presence of automatic (implantable) cardiac defibrillator: Secondary | ICD-10-CM | POA: Diagnosis not present

## 2019-05-26 DIAGNOSIS — Z95810 Presence of automatic (implantable) cardiac defibrillator: Secondary | ICD-10-CM | POA: Diagnosis not present

## 2019-06-01 ENCOUNTER — Other Ambulatory Visit: Payer: Self-pay | Admitting: Physician Assistant

## 2019-06-01 DIAGNOSIS — I472 Ventricular tachycardia: Secondary | ICD-10-CM | POA: Diagnosis not present

## 2019-06-01 DIAGNOSIS — E1149 Type 2 diabetes mellitus with other diabetic neurological complication: Secondary | ICD-10-CM | POA: Diagnosis not present

## 2019-06-01 DIAGNOSIS — Z79899 Other long term (current) drug therapy: Secondary | ICD-10-CM | POA: Diagnosis not present

## 2019-06-01 DIAGNOSIS — Z1231 Encounter for screening mammogram for malignant neoplasm of breast: Secondary | ICD-10-CM

## 2019-06-01 DIAGNOSIS — E7800 Pure hypercholesterolemia, unspecified: Secondary | ICD-10-CM | POA: Diagnosis not present

## 2019-06-01 DIAGNOSIS — I2510 Atherosclerotic heart disease of native coronary artery without angina pectoris: Secondary | ICD-10-CM | POA: Diagnosis not present

## 2019-06-01 DIAGNOSIS — I10 Essential (primary) hypertension: Secondary | ICD-10-CM | POA: Diagnosis not present

## 2019-06-01 DIAGNOSIS — Z1331 Encounter for screening for depression: Secondary | ICD-10-CM | POA: Diagnosis not present

## 2019-06-24 ENCOUNTER — Ambulatory Visit
Admission: RE | Admit: 2019-06-24 | Discharge: 2019-06-24 | Disposition: A | Discharge status: Home / Self Care | Payer: Medicare Other | Source: Ambulatory Visit | Attending: Physician Assistant | Admitting: Physician Assistant

## 2019-06-24 DIAGNOSIS — Z1231 Encounter for screening mammogram for malignant neoplasm of breast: Secondary | ICD-10-CM | POA: Diagnosis not present

## 2019-07-08 DIAGNOSIS — Z79899 Other long term (current) drug therapy: Secondary | ICD-10-CM | POA: Diagnosis not present

## 2019-07-08 DIAGNOSIS — Z5181 Encounter for therapeutic drug level monitoring: Secondary | ICD-10-CM | POA: Diagnosis not present

## 2019-07-08 DIAGNOSIS — Z8674 Personal history of sudden cardiac arrest: Secondary | ICD-10-CM | POA: Diagnosis not present

## 2019-07-08 DIAGNOSIS — I2510 Atherosclerotic heart disease of native coronary artery without angina pectoris: Secondary | ICD-10-CM | POA: Diagnosis not present

## 2019-07-08 DIAGNOSIS — E119 Type 2 diabetes mellitus without complications: Secondary | ICD-10-CM | POA: Diagnosis not present

## 2019-07-08 DIAGNOSIS — Z794 Long term (current) use of insulin: Secondary | ICD-10-CM | POA: Diagnosis not present

## 2019-07-15 DIAGNOSIS — N390 Urinary tract infection, site not specified: Secondary | ICD-10-CM | POA: Diagnosis not present

## 2019-07-15 DIAGNOSIS — Z6826 Body mass index (BMI) 26.0-26.9, adult: Secondary | ICD-10-CM | POA: Diagnosis not present

## 2019-07-28 DIAGNOSIS — N390 Urinary tract infection, site not specified: Secondary | ICD-10-CM | POA: Diagnosis not present

## 2019-07-28 DIAGNOSIS — Z6826 Body mass index (BMI) 26.0-26.9, adult: Secondary | ICD-10-CM | POA: Diagnosis not present

## 2019-08-01 DIAGNOSIS — Z20822 Contact with and (suspected) exposure to covid-19: Secondary | ICD-10-CM | POA: Diagnosis not present

## 2019-08-01 DIAGNOSIS — E1149 Type 2 diabetes mellitus with other diabetic neurological complication: Secondary | ICD-10-CM | POA: Diagnosis not present

## 2019-08-25 DIAGNOSIS — Z95810 Presence of automatic (implantable) cardiac defibrillator: Secondary | ICD-10-CM | POA: Diagnosis not present

## 2019-09-26 DIAGNOSIS — R300 Dysuria: Secondary | ICD-10-CM | POA: Diagnosis not present

## 2019-09-26 DIAGNOSIS — Z6827 Body mass index (BMI) 27.0-27.9, adult: Secondary | ICD-10-CM | POA: Diagnosis not present

## 2019-09-26 DIAGNOSIS — I10 Essential (primary) hypertension: Secondary | ICD-10-CM | POA: Diagnosis not present

## 2019-09-26 DIAGNOSIS — I2510 Atherosclerotic heart disease of native coronary artery without angina pectoris: Secondary | ICD-10-CM | POA: Diagnosis not present

## 2019-10-11 DIAGNOSIS — N816 Rectocele: Secondary | ICD-10-CM | POA: Diagnosis not present

## 2019-10-11 DIAGNOSIS — R351 Nocturia: Secondary | ICD-10-CM | POA: Diagnosis not present

## 2019-10-11 DIAGNOSIS — N390 Urinary tract infection, site not specified: Secondary | ICD-10-CM | POA: Diagnosis not present

## 2019-10-11 DIAGNOSIS — N952 Postmenopausal atrophic vaginitis: Secondary | ICD-10-CM | POA: Diagnosis not present

## 2019-10-11 DIAGNOSIS — Z79899 Other long term (current) drug therapy: Secondary | ICD-10-CM | POA: Diagnosis not present

## 2019-10-27 DIAGNOSIS — Z95810 Presence of automatic (implantable) cardiac defibrillator: Secondary | ICD-10-CM | POA: Diagnosis not present

## 2019-11-03 DIAGNOSIS — Z79899 Other long term (current) drug therapy: Secondary | ICD-10-CM | POA: Diagnosis not present

## 2019-11-03 DIAGNOSIS — E1149 Type 2 diabetes mellitus with other diabetic neurological complication: Secondary | ICD-10-CM | POA: Diagnosis not present

## 2019-11-03 DIAGNOSIS — I472 Ventricular tachycardia: Secondary | ICD-10-CM | POA: Diagnosis not present

## 2019-11-03 DIAGNOSIS — I10 Essential (primary) hypertension: Secondary | ICD-10-CM | POA: Diagnosis not present

## 2019-11-03 DIAGNOSIS — I2510 Atherosclerotic heart disease of native coronary artery without angina pectoris: Secondary | ICD-10-CM | POA: Diagnosis not present

## 2019-11-03 DIAGNOSIS — E7800 Pure hypercholesterolemia, unspecified: Secondary | ICD-10-CM | POA: Diagnosis not present

## 2019-11-09 DIAGNOSIS — N816 Rectocele: Secondary | ICD-10-CM | POA: Diagnosis not present

## 2019-11-09 DIAGNOSIS — N952 Postmenopausal atrophic vaginitis: Secondary | ICD-10-CM | POA: Diagnosis not present

## 2019-11-09 DIAGNOSIS — N390 Urinary tract infection, site not specified: Secondary | ICD-10-CM | POA: Diagnosis not present

## 2019-11-09 DIAGNOSIS — R351 Nocturia: Secondary | ICD-10-CM | POA: Diagnosis not present

## 2020-01-26 DIAGNOSIS — Z95810 Presence of automatic (implantable) cardiac defibrillator: Secondary | ICD-10-CM | POA: Diagnosis not present

## 2020-03-19 DIAGNOSIS — E7800 Pure hypercholesterolemia, unspecified: Secondary | ICD-10-CM | POA: Diagnosis not present

## 2020-03-19 DIAGNOSIS — Z538 Procedure and treatment not carried out for other reasons: Secondary | ICD-10-CM | POA: Diagnosis not present

## 2020-03-19 DIAGNOSIS — Z95810 Presence of automatic (implantable) cardiac defibrillator: Secondary | ICD-10-CM | POA: Diagnosis not present

## 2020-03-19 DIAGNOSIS — I110 Hypertensive heart disease with heart failure: Secondary | ICD-10-CM | POA: Diagnosis not present

## 2020-03-19 DIAGNOSIS — S42252A Displaced fracture of greater tuberosity of left humerus, initial encounter for closed fracture: Secondary | ICD-10-CM | POA: Diagnosis not present

## 2020-03-19 DIAGNOSIS — S42212A Unspecified displaced fracture of surgical neck of left humerus, initial encounter for closed fracture: Secondary | ICD-10-CM | POA: Diagnosis not present

## 2020-03-19 DIAGNOSIS — G8918 Other acute postprocedural pain: Secondary | ICD-10-CM | POA: Diagnosis not present

## 2020-03-19 DIAGNOSIS — I361 Nonrheumatic tricuspid (valve) insufficiency: Secondary | ICD-10-CM | POA: Diagnosis not present

## 2020-03-19 DIAGNOSIS — S42292A Other displaced fracture of upper end of left humerus, initial encounter for closed fracture: Secondary | ICD-10-CM | POA: Diagnosis not present

## 2020-03-19 DIAGNOSIS — I350 Nonrheumatic aortic (valve) stenosis: Secondary | ICD-10-CM | POA: Diagnosis not present

## 2020-03-19 DIAGNOSIS — I472 Ventricular tachycardia: Secondary | ICD-10-CM | POA: Diagnosis not present

## 2020-03-19 DIAGNOSIS — S42242A 4-part fracture of surgical neck of left humerus, initial encounter for closed fracture: Secondary | ICD-10-CM | POA: Diagnosis not present

## 2020-03-19 DIAGNOSIS — Z01810 Encounter for preprocedural cardiovascular examination: Secondary | ICD-10-CM | POA: Diagnosis not present

## 2020-03-19 DIAGNOSIS — Z043 Encounter for examination and observation following other accident: Secondary | ICD-10-CM | POA: Diagnosis not present

## 2020-03-19 DIAGNOSIS — R011 Cardiac murmur, unspecified: Secondary | ICD-10-CM | POA: Diagnosis not present

## 2020-03-19 DIAGNOSIS — E119 Type 2 diabetes mellitus without complications: Secondary | ICD-10-CM | POA: Diagnosis not present

## 2020-03-19 DIAGNOSIS — S42202A Unspecified fracture of upper end of left humerus, initial encounter for closed fracture: Secondary | ICD-10-CM | POA: Diagnosis not present

## 2020-03-19 DIAGNOSIS — R55 Syncope and collapse: Secondary | ICD-10-CM | POA: Diagnosis not present

## 2020-03-19 DIAGNOSIS — Z5309 Procedure and treatment not carried out because of other contraindication: Secondary | ICD-10-CM | POA: Diagnosis not present

## 2020-03-19 DIAGNOSIS — Z885 Allergy status to narcotic agent status: Secondary | ICD-10-CM | POA: Diagnosis not present

## 2020-03-19 DIAGNOSIS — Z882 Allergy status to sulfonamides status: Secondary | ICD-10-CM | POA: Diagnosis not present

## 2020-03-19 DIAGNOSIS — E2740 Unspecified adrenocortical insufficiency: Secondary | ICD-10-CM | POA: Diagnosis not present

## 2020-03-19 DIAGNOSIS — K529 Noninfective gastroenteritis and colitis, unspecified: Secondary | ICD-10-CM | POA: Diagnosis not present

## 2020-03-19 DIAGNOSIS — I951 Orthostatic hypotension: Secondary | ICD-10-CM | POA: Diagnosis not present

## 2020-03-19 DIAGNOSIS — I10 Essential (primary) hypertension: Secondary | ICD-10-CM | POA: Diagnosis not present

## 2020-03-19 DIAGNOSIS — E860 Dehydration: Secondary | ICD-10-CM | POA: Diagnosis not present

## 2020-03-19 DIAGNOSIS — W19XXXA Unspecified fall, initial encounter: Secondary | ICD-10-CM | POA: Diagnosis not present

## 2020-03-19 DIAGNOSIS — I5032 Chronic diastolic (congestive) heart failure: Secondary | ICD-10-CM | POA: Diagnosis not present

## 2020-03-19 DIAGNOSIS — M810 Age-related osteoporosis without current pathological fracture: Secondary | ICD-10-CM | POA: Diagnosis not present

## 2020-03-19 DIAGNOSIS — E785 Hyperlipidemia, unspecified: Secondary | ICD-10-CM | POA: Diagnosis not present

## 2020-03-20 DIAGNOSIS — I361 Nonrheumatic tricuspid (valve) insufficiency: Secondary | ICD-10-CM

## 2020-03-20 DIAGNOSIS — E119 Type 2 diabetes mellitus without complications: Secondary | ICD-10-CM

## 2020-03-20 DIAGNOSIS — Z01810 Encounter for preprocedural cardiovascular examination: Secondary | ICD-10-CM

## 2020-03-20 DIAGNOSIS — I951 Orthostatic hypotension: Secondary | ICD-10-CM

## 2020-03-20 DIAGNOSIS — R55 Syncope and collapse: Secondary | ICD-10-CM

## 2020-03-20 DIAGNOSIS — I350 Nonrheumatic aortic (valve) stenosis: Secondary | ICD-10-CM

## 2020-03-20 DIAGNOSIS — I10 Essential (primary) hypertension: Secondary | ICD-10-CM

## 2020-03-20 DIAGNOSIS — Z95810 Presence of automatic (implantable) cardiac defibrillator: Secondary | ICD-10-CM

## 2020-03-20 DIAGNOSIS — E785 Hyperlipidemia, unspecified: Secondary | ICD-10-CM

## 2020-03-20 DIAGNOSIS — S42202A Unspecified fracture of upper end of left humerus, initial encounter for closed fracture: Secondary | ICD-10-CM

## 2020-03-23 DIAGNOSIS — R55 Syncope and collapse: Secondary | ICD-10-CM

## 2020-03-23 DIAGNOSIS — S42202A Unspecified fracture of upper end of left humerus, initial encounter for closed fracture: Secondary | ICD-10-CM | POA: Diagnosis not present

## 2020-03-23 DIAGNOSIS — E119 Type 2 diabetes mellitus without complications: Secondary | ICD-10-CM

## 2020-03-23 DIAGNOSIS — I10 Essential (primary) hypertension: Secondary | ICD-10-CM

## 2020-03-23 DIAGNOSIS — I350 Nonrheumatic aortic (valve) stenosis: Secondary | ICD-10-CM

## 2020-03-23 DIAGNOSIS — Z95810 Presence of automatic (implantable) cardiac defibrillator: Secondary | ICD-10-CM

## 2020-03-23 DIAGNOSIS — I472 Ventricular tachycardia: Secondary | ICD-10-CM

## 2020-03-23 DIAGNOSIS — E785 Hyperlipidemia, unspecified: Secondary | ICD-10-CM

## 2020-03-27 DIAGNOSIS — Z79899 Other long term (current) drug therapy: Secondary | ICD-10-CM | POA: Diagnosis not present

## 2020-03-27 DIAGNOSIS — I951 Orthostatic hypotension: Secondary | ICD-10-CM | POA: Diagnosis not present

## 2020-03-27 DIAGNOSIS — S42302A Unspecified fracture of shaft of humerus, left arm, initial encounter for closed fracture: Secondary | ICD-10-CM | POA: Diagnosis not present

## 2020-03-27 DIAGNOSIS — E2740 Unspecified adrenocortical insufficiency: Secondary | ICD-10-CM | POA: Diagnosis not present

## 2020-03-27 DIAGNOSIS — R55 Syncope and collapse: Secondary | ICD-10-CM | POA: Diagnosis not present

## 2020-04-02 DIAGNOSIS — I469 Cardiac arrest, cause unspecified: Secondary | ICD-10-CM | POA: Diagnosis not present

## 2020-04-02 DIAGNOSIS — I255 Ischemic cardiomyopathy: Secondary | ICD-10-CM | POA: Diagnosis not present

## 2020-04-02 DIAGNOSIS — I2510 Atherosclerotic heart disease of native coronary artery without angina pectoris: Secondary | ICD-10-CM | POA: Diagnosis not present

## 2020-04-02 DIAGNOSIS — Z95810 Presence of automatic (implantable) cardiac defibrillator: Secondary | ICD-10-CM | POA: Diagnosis not present

## 2020-04-04 DIAGNOSIS — S4992XA Unspecified injury of left shoulder and upper arm, initial encounter: Secondary | ICD-10-CM | POA: Diagnosis not present

## 2020-04-04 DIAGNOSIS — S42202A Unspecified fracture of upper end of left humerus, initial encounter for closed fracture: Secondary | ICD-10-CM | POA: Insufficient documentation

## 2020-04-06 DIAGNOSIS — S42202A Unspecified fracture of upper end of left humerus, initial encounter for closed fracture: Secondary | ICD-10-CM | POA: Diagnosis not present

## 2020-04-06 DIAGNOSIS — S42132A Displaced fracture of coracoid process, left shoulder, initial encounter for closed fracture: Secondary | ICD-10-CM | POA: Diagnosis not present

## 2020-04-06 DIAGNOSIS — Z01818 Encounter for other preprocedural examination: Secondary | ICD-10-CM | POA: Diagnosis not present

## 2020-04-06 DIAGNOSIS — S42292A Other displaced fracture of upper end of left humerus, initial encounter for closed fracture: Secondary | ICD-10-CM | POA: Diagnosis not present

## 2020-04-06 DIAGNOSIS — S4992XA Unspecified injury of left shoulder and upper arm, initial encounter: Secondary | ICD-10-CM | POA: Diagnosis not present

## 2020-04-10 DIAGNOSIS — Z01818 Encounter for other preprocedural examination: Secondary | ICD-10-CM | POA: Diagnosis not present

## 2020-04-12 DIAGNOSIS — Z96612 Presence of left artificial shoulder joint: Secondary | ICD-10-CM | POA: Diagnosis not present

## 2020-04-12 DIAGNOSIS — G8918 Other acute postprocedural pain: Secondary | ICD-10-CM | POA: Diagnosis not present

## 2020-04-12 DIAGNOSIS — S42202A Unspecified fracture of upper end of left humerus, initial encounter for closed fracture: Secondary | ICD-10-CM | POA: Diagnosis not present

## 2020-04-12 DIAGNOSIS — Z471 Aftercare following joint replacement surgery: Secondary | ICD-10-CM | POA: Diagnosis not present

## 2020-04-13 DIAGNOSIS — W19XXXA Unspecified fall, initial encounter: Secondary | ICD-10-CM | POA: Diagnosis not present

## 2020-04-13 DIAGNOSIS — S42202A Unspecified fracture of upper end of left humerus, initial encounter for closed fracture: Secondary | ICD-10-CM | POA: Diagnosis not present

## 2020-04-13 DIAGNOSIS — Z96612 Presence of left artificial shoulder joint: Secondary | ICD-10-CM | POA: Insufficient documentation

## 2020-06-13 ENCOUNTER — Ambulatory Visit: Payer: BC Managed Care – PPO | Admitting: Internal Medicine

## 2020-08-07 NOTE — Progress Notes (Signed)
Name: REGINNA SERMENO  MRN/ DOB: [id number], Nov 02, 1945    Age/ Sex: 75 y.o., female    PCP: Cyndi Bender, PA-C   Reason for Endocrinology Evaluation: Adrenal insufficiency     Date of Initial Endocrinology Evaluation: 08/08/2020     HPI: Ms. TENIKA KEERAN is a 75 y.o. 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 The patient presented for initial endocrinology clinic visit on 08/08/2020 for consultative assistance with her Adrenal Insufficiency    She is accompanied by her husband today.    She was noted to have orthostatic hypotension during hospitalization for left humeral fracture (03/2020) with BP as low as 70/50 mmhg. She was diagnosed with adrenal insufficiency with a serum cortisol of 1.4 ug/dL and started on dexamethasone  At the time.   In review of her records she had a serum glucose of 161 mg/dL, , Na 142 mmol/L and K 4.8 mmol/L with normal GFR during the ED visit  No prior history or glucocorticoid intake     She has been diagnosed with Osteoporosis years ago. Was on Fosamax  But was cost prohibitive per pt . She is not on calcium nor vitamin D.  No prior fractures except the humerus in 03/2020    Pt has diabetes since ~ 2017. This has been controlled on Metformin only , with an A1c of 7.0 % per pt  A few months ago ( these records are not available. )   But as noted severe hyperglycemia since being on Dexamethasone    Does not recall change out of the ordinary fatigue  Has chronic diarrhea    She has severe  heartburn and on PPI   Denies polyuria and polydipsia but per husband has frequency that's attributed to diuretic use     HISTORY:  Past Medical History:  Past Medical History:  Diagnosis Date  . AICD (automatic cardioverter/defibrillator) present   . Coronary artery disease   . Hypercholesteremia   . Hypertension   . Myocardial infarction ([place])   . Presence of permanent  cardiac pacemaker    Past Surgical History:  Past Surgical History:  Procedure Laterality Date  . ABDOMINAL HYSTERECTOMY    . CHOLECYSTECTOMY    . COLONOSCOPY WITH PROPOFOL N/A 10/14/2016   Procedure: COLONOSCOPY WITH PROPOFOL;  Surgeon: Lollie Sails, MD;  Location: [facility] ENDOSCOPY;  Service: Endoscopy;  Laterality: N/A;  . ESOPHAGOGASTRODUODENOSCOPY (EGD) WITH PROPOFOL N/A 10/14/2016   Procedure: ESOPHAGOGASTRODUODENOSCOPY (EGD) WITH PROPOFOL;  Surgeon: Lollie Sails, MD;  Location: [facility] ENDOSCOPY;  Service: Endoscopy;  Laterality: N/A;  . GOITER SURGERY    . INSERTION PACEMAKER/DEFIB    . KIDNEY STONE SURGERY    . TUBAL LIGATION        Social History:  reports that she has never smoked. She has never used smokeless tobacco. She reports that she does not drink alcohol and does not use drugs.  Family History: family history is not on file.   HOME MEDICATIONS: Allergies as of 08/08/2020      Reactions   Hydrocodone-acetaminophen Nausea Only   Sulfa Antibiotics Other (See Comments)   UNKNOWN      Medication List       Accurate as of August 08, 2020  8:54 AM. If you have any questions, ask your nurse or doctor.        STOP taking these medications   Acidophilus Lactobacillus Caps Stopped by:  Dorita Sciara, MD   dexamethasone 0.5 MG tablet Commonly known as: DECADRON Stopped by: Dorita Sciara, MD   famotidine 10 MG tablet Commonly known as: PEPCID Stopped by: Dorita Sciara, MD   ranitidine 150 MG tablet Commonly known as: ZANTAC Stopped by: Dorita Sciara, MD   simvastatin 20 MG tablet Commonly known as: ZOCOR Stopped by: Dorita Sciara, MD     TAKE these medications   amiodarone 200 MG tablet Commonly known as: PACERONE Take 100 mg by mouth every other day.   aspirin EC 81 MG tablet Take 81 mg by mouth daily.   atorvastatin 20 MG tablet Commonly known as: LIPITOR Take 20 mg by mouth daily.   furosemide 20 MG  tablet Commonly known as: LASIX Take 20 mg by mouth daily.   hydrocortisone 10 MG tablet Commonly known as: CORTEF Take 1 tablet (10 mg total) by mouth as directed. 1.5 tablets with breakfast and Half a tablet between 2-4 pm Started by: Dorita Sciara, MD   metFORMIN 500 MG 24 hr tablet Commonly known as: GLUCOPHAGE-XR Take 500 mg by mouth daily.   metoprolol tartrate 25 MG tablet Commonly known as: LOPRESSOR Take 12.5 mg by mouth 2 (two) times daily. What changed: Another medication with the same name was removed. Continue taking this medication, and follow the directions you see here. Changed by: Dorita Sciara, MD   mupirocin ointment 2 % Commonly known as: BACTROBAN PLEASE SEE ATTACHED FOR DETAILED DIRECTIONS   omeprazole 20 MG capsule Commonly known as: PRILOSEC Take 20 mg by mouth daily.   spironolactone 25 MG tablet Commonly known as: ALDACTONE Take 25 mg by mouth daily.         REVIEW OF SYSTEMS: A comprehensive ROS was conducted with the patient and is negative except as per HPI    OBJECTIVE:  VS: BP 128/70   Pulse 91   Ht 5\' 6"  (1.676 m)   Wt 170 lb 4 oz (77.2 kg)   SpO2 94%   BMI 27.48 kg/m    Wt Readings from Last 3 Encounters:  08/08/20 170 lb 4 oz (77.2 kg)  10/14/16 182 lb (82.6 kg)   Body surface area is 1.9 meters squared.   EXAM: General: Pt appears well and is in NAD Kyphosis noted   Neck: General: Supple without adenopathy. Thyroid: Thyroid size normal.  No goiter or nodules appreciated.   Lungs: Clear with good BS bilat with no rales, rhonchi, or wheezes  Heart: Auscultation: RRR.  Abdomen: Normoactive bowel sounds, soft, nontender, without masses or organomegaly palpable  Extremities:  BL LE: No pretibial edema normal ROM and strength.  Mental Status: Judgment, insight: Intact Memory: Intact for recent and remote events Mood and affect: No depression, anxiety, or agitation     DATA REVIEWED:   Results for  KEYRI, SALBERG (MRN [id number]) as of 08/09/2020 12:16  Ref. Range 08/08/2020 09:02 08/08/2020 09:04  Sodium Latest Ref Range: 135 - 145 mEq/L 136   Potassium Latest Ref Range: 3.5 - 5.1 mEq/L 4.1   Chloride Latest Ref Range: 96 - 112 mEq/L 100   CO2 Latest Ref Range: 19 - 32 mEq/L 27   Glucose Latest Ref Range: 70 - 99 mg/dL 248 (H)   BUN Latest Ref Range: 6 - 23 mg/dL 28 (H)   Creatinine Latest Ref Range: 0.40 - 1.20 mg/dL 1.06   Calcium Latest Ref Range: 8.4 - 10.5 mg/dL 10.1   Phosphorus Latest Ref Range: 2.3 -  4.6 mg/dL 3.8   Magnesium Latest Ref Range: 1.5 - 2.5 mg/dL 1.3 (L)   Alkaline Phosphatase Latest Ref Range: 39 - 117 U/L 85   Albumin Latest Ref Range: 3.5 - 5.2 g/dL 4.0   AST Latest Ref Range: 0 - 37 U/L 19   ALT Latest Ref Range: 0 - 35 U/L 25   Total Protein Latest Ref Range: 6.0 - 8.3 g/dL 7.3   Total Bilirubin Latest Ref Range: 0.2 - 1.2 mg/dL 1.0   GFR Latest Ref Range: >60.00 mL/min 52.44 (L)   VITD Latest Ref Range: 30.00 - 100.00 ng/mL 20.16 (L)   Hemoglobin A1C Latest Ref Range: 4.0 - 5.6 %  10.4 (A)  TSH Latest Ref Range: 0.35 - 4.50 uIU/mL 3.70   T4,Free(Direct) Latest Ref Range: 0.60 - 1.60 ng/dL 1.14     ASSESSMENT/PLAN/RECOMMENDATIONS:   1. Adrenal Insufficiency   - This was based on hypotension and low serum cortisol . She has been on Dexamethasone for 5 months now - will switch to Hydrocortisone  - Discussed sick day rule  - BMP normal   Medications : Hydrocortisone 10 mg, 1.5 tabs with Breakfast and half a tablet between 2-4 pm     2. Osteoporosis    - Osteoporosis with fragility fracture  - She was encouraged to start calcium and Vitamin D  - Was on Fosamax but per pt she was on another oral monthly medication for osteoporosis but was cost prohibitive at the time . - I have recommended PTH analogs as the best option for osteoporosis and fragility fracture but she declines as daily injections would cause hardship on her.  - She agreed to  starting prolia, will start the process for this  - TFT's Normal   Medications : - Start Calcium 600 mg BID  - Vitamin D  - Prolia 60 mg [place] Q 6 months     3. Diabetes Mellitus with Hyperglycemia :   - A1c 10.4 % today  - She was advised to avoid sugar-sweetened beverages  - Will start basal insulin and increase Metformin as below   Medications : Increase Metformin 500 mg BID  Start Lantus 16 units daily   4. Hypomagnsemia :   - Will replenish Mag chloride 70 mg daily   5. Vitamin D deficiency :   - Will replenish with ergocalciferol 50,000 iu weekly      F/U in 2 months   Signed electronically by: Mack Guise, MD  [facility] Endocrinology  [facility] [address] [place], [place] [address] Phone: [phone] FAX: [phone]   CC: [address] [place] [address] Phone: [phone] Fax: [phone]   Return to Endocrinology clinic as below: No future appointments.

## 2020-08-08 ENCOUNTER — Telehealth: Payer: Self-pay | Admitting: Internal Medicine

## 2020-08-08 ENCOUNTER — Encounter: Payer: Self-pay | Admitting: Internal Medicine

## 2020-08-08 ENCOUNTER — Other Ambulatory Visit: Payer: Self-pay | Admitting: Internal Medicine

## 2020-08-08 ENCOUNTER — Ambulatory Visit (INDEPENDENT_AMBULATORY_CARE_PROVIDER_SITE_OTHER): Payer: 59 | Admitting: Internal Medicine

## 2020-08-08 ENCOUNTER — Other Ambulatory Visit: Payer: Self-pay

## 2020-08-08 VITALS — BP 128/70 | HR 91 | Ht 66.0 in | Wt 170.2 lb

## 2020-08-08 DIAGNOSIS — E2740 Unspecified adrenocortical insufficiency: Secondary | ICD-10-CM | POA: Diagnosis not present

## 2020-08-08 DIAGNOSIS — M8000XS Age-related osteoporosis with current pathological fracture, unspecified site, sequela: Secondary | ICD-10-CM | POA: Diagnosis not present

## 2020-08-08 DIAGNOSIS — E1165 Type 2 diabetes mellitus with hyperglycemia: Secondary | ICD-10-CM

## 2020-08-08 DIAGNOSIS — M8000XA Age-related osteoporosis with current pathological fracture, unspecified site, initial encounter for fracture: Secondary | ICD-10-CM | POA: Insufficient documentation

## 2020-08-08 LAB — COMPREHENSIVE METABOLIC PANEL
ALT: 25 U/L (ref 0–35)
AST: 19 U/L (ref 0–37)
Albumin: 4 g/dL (ref 3.5–5.2)
Alkaline Phosphatase: 85 U/L (ref 39–117)
BUN: 28 mg/dL — ABNORMAL HIGH (ref 6–23)
CO2: 27 mEq/L (ref 19–32)
Calcium: 10.1 mg/dL (ref 8.4–10.5)
Chloride: 100 mEq/L (ref 96–112)
Creatinine, Ser: 1.06 mg/dL (ref 0.40–1.20)
GFR: 52.44 mL/min — ABNORMAL LOW (ref >60.00)
Glucose, Bld: 248 mg/dL — ABNORMAL HIGH (ref 70–99)
Potassium: 4.1 mEq/L (ref 3.5–5.1)
Sodium: 136 mEq/L (ref 135–145)
Total Bilirubin: 1 mg/dL (ref 0.2–1.2)
Total Protein: 7.3 g/dL (ref 6.0–8.3)

## 2020-08-08 LAB — POCT GLYCOSYLATED HEMOGLOBIN (HGB A1C): Hemoglobin A1C: 10.4 % — AB (ref 4.0–5.6)

## 2020-08-08 LAB — TSH: TSH: 3.7 u[IU]/mL (ref 0.35–4.50)

## 2020-08-08 LAB — T4, FREE: Free T4: 1.14 ng/dL (ref 0.60–1.60)

## 2020-08-08 LAB — VITAMIN D 25 HYDROXY (VIT D DEFICIENCY, FRACTURES): VITD: 20.16 ng/mL — ABNORMAL LOW (ref 30.00–100.00)

## 2020-08-08 LAB — MAGNESIUM: Magnesium: 1.3 mg/dL — ABNORMAL LOW (ref 1.5–2.5)

## 2020-08-08 LAB — PHOSPHORUS: Phosphorus: 3.8 mg/dL (ref 2.3–4.6)

## 2020-08-08 MED ORDER — HYDROCORTISONE 10 MG PO TABS: 10.0000 mg | ORAL_TABLET | ORAL | 1 refills | Status: DC

## 2020-08-08 MED ORDER — LANTUS SOLOSTAR 100 UNIT/ML ~~LOC~~ SOPN: 16.0000 [IU] | PEN_INJECTOR | Freq: Every day | SUBCUTANEOUS | 11 refills | Status: DC

## 2020-08-08 MED ORDER — METFORMIN HCL ER 500 MG PO TB24: 500.0000 mg | ORAL_TABLET | Freq: Two times a day (BID) | ORAL | 3 refills | Status: DC

## 2020-08-08 MED ORDER — INSULIN PEN NEEDLE 32G X 4 MM MISC: 1.0000 | Freq: Every day | 11 refills | Status: DC

## 2020-08-08 NOTE — Patient Instructions (Addendum)
FOR ADRENAL:   - STOP Dexamethasone  - Start Hydrocortisone 10 mg, ONE AND HALF tablet with Breakfast and HALF a tablet between 2-4 PM   ADRENAL INSUFFICIENCY SICK DAY RULES:  Should you face an extreme emotional or physical stress such as trauma, surgery or acute illness, this will require extra steroid coverage so that the body can meet that stress.   Without increasing the steroid dose you may experience severe weakness, headache, dizziness, nausea and vomiting and possibly a more serious deterioration in health.  Typically the dose of steroids will only need to be increased for a couple of days if you have an illness that is transient and managed in the community.   If you are unable to take/absorb an increased dose of steroids orally because of vomiting or diarrhea, you will urgently require steroid injections and should present to an Emergency Department.  The general advice for any serious illness is as follows: 1. Double the normal daily steroid dose for up to 3 days if you have a temperature of more than 37.50C (99.4F) with signs of sickness, or severe emotional or physical distress 2. Contact your primary care doctor and Endocrinologist if the illness worsens or it lasts for more than 3 days.  3. In cases of severe illness, urgent medical assistance should be promptly sought. 4. If you experience vomiting/diarrhea or are unable to take steroids by mouth, please administer the Hydrocortisone injection kit and seek urgent medical help.     FOR BONES: Start Calcium 600 mg TWICE daily  Will try to authorize  prolia through your insurance       FOR DIABETES:  - Increase Metformin 500 mg, 1 tablet with Breakfast and 1 tablet with Supper  - Start Lantus 16 units daily      -HOW TO TREAT LOW BLOOD SUGARS (Blood sugar LESS THAN 70 MG/DL)  Please follow the RULE OF 15 for the treatment of hypoglycemia treatment (when your (blood sugars are less than 70 mg/dL)    STEP 1:  Take 15 grams of carbohydrates when your blood sugar is low, which includes:   3-4 GLUCOSE TABS  OR  3-4 OZ OF JUICE OR REGULAR SODA OR  ONE TUBE OF GLUCOSE GEL     STEP 2: RECHECK blood sugar in 15 MINUTES STEP 3: If your blood sugar is still low at the 15 minute recheck --> then, go back to STEP 1 and treat AGAIN with another 15 grams of carbohydrates.

## 2020-08-08 NOTE — Telephone Encounter (Signed)
Patient's spouse called to find out when the Lantus will be sent to the pharmacy for her.  Notes are in the AVS for patient to start 16 units of Lantus daily.   Call back # [phone] or [phone]

## 2020-08-08 NOTE — Telephone Encounter (Signed)
Pharmacy called to find out if the hydrocortisone that was just prescribed is replaced the Dexamethasone that the patient is currently taking.  Call back number for pharmacy is [phone]

## 2020-08-08 NOTE — Telephone Encounter (Signed)
Called and spoken to [facility] to confirm the change per Dr Quin Hoop progress notes

## 2020-08-08 NOTE — Telephone Encounter (Signed)
Can you please add her to the Prolia list ?    Thanks  Melissa Nena Jordan, MD  [facility] Endocrinology  [facility] [address] [place], [place] [address] Phone: [phone] FAX: [phone]

## 2020-08-08 NOTE — Telephone Encounter (Addendum)
Spoken to patient and notified Rx have been [facility] to [facility]. Verbalized understanding.

## 2020-08-08 NOTE — Telephone Encounter (Signed)
Please advise 

## 2020-08-08 NOTE — Telephone Encounter (Signed)
done

## 2020-08-09 ENCOUNTER — Telehealth: Payer: Self-pay | Admitting: Internal Medicine

## 2020-08-09 MED ORDER — MAGNESIUM CHLORIDE 70 MG PO TBEC: 70.0000 mg | DELAYED_RELEASE_TABLET | Freq: Every day | ORAL | 1 refills | Status: DC

## 2020-08-09 MED ORDER — ERGOCALCIFEROL 1.25 MG (50000 UT) PO CAPS: 50000.0000 [IU] | ORAL_CAPSULE | ORAL | 1 refills | Status: DC

## 2020-08-09 NOTE — Telephone Encounter (Signed)
Prolia VOB initiated via [employer] portal.  

## 2020-08-09 NOTE — Telephone Encounter (Signed)
PLease let the pt know that her thyroid is normal, but her Vitamin D is low and I sent a prescription of Ergocalciferol 50,000 iu to be taken ONCE weekly   Magnesium is low as well, a prescription for magnesium has been sent to the pharmacy to be taken once DAILy     Thanks   Abby Nena Jordan, MD  [facility] Endocrinology  [facility] [address] [place], [place] [address] Phone: [phone] FAX: [phone]

## 2020-08-09 NOTE — Telephone Encounter (Signed)
Spoken to patient and notified Dr Shamleffer's comments. Verbalized understanding.   

## 2020-08-11 LAB — ACTH: C206 ACTH: 8 pg/mL (ref 6–50)

## 2020-08-14 NOTE — Telephone Encounter (Signed)
PA REQUIRED  Prolia OOP cost: 0% Admin fee: 50%  PRIMARY MEDICAL BENEFIT DETAILS (PHYSICIAN PURCHASE, OR REFERRAL TO TREATING SITE) COVERAGE AVAILABLE:  COVERAGE DETAILS:   AUTHORIZATION REQUIRED: Yes PA PROCESS DETAILS: Please complete the prior authorization form located at [email or web address] or call [phone].

## 2020-08-16 NOTE — Telephone Encounter (Signed)
PA initiated via [email or web address]   Your Authorization Request Is Pending  Your request number is [id number]. Your request may require review by our clinical team. Also, if additional information is needed to make a determination, we will reach out to you via the contact information provided below. Please see below for details regarding your request.  DEXA 10/28/2017 Distal radius -3.8 L1-L4 n/a   Prior meds Fosamax - d/c d/t cost  No ca+D, start on 08/08/20 POS OP fracture hx.

## 2020-08-20 ENCOUNTER — Telehealth: Payer: Self-pay | Admitting: Internal Medicine

## 2020-08-20 NOTE — Telephone Encounter (Signed)
Your Authorization Request Has Been Approved  Your authorization request number is [id number]. If you need to add a new specialty drug covered under the medical benefit, you will need to submit a new authorization request.  Authorization Status Approved  Authorization Number [id number]   Authorization Start Date 08-17-2020  Authorization End Date 08-17-2021

## 2020-08-20 NOTE — Telephone Encounter (Signed)
Pt's husband called because recently pt went to see Dr.Shamleffer and was put on some new medications (hydrocortisone and some other medications) and ever since he states his wife has not been the same and has been going downhill. He says she can't walk, she's extremely tired and can't remember anything even though she is usually an expert when it comes to numbers and remembering. Pt's husband wants to know if this could be due to the new medications? He requests a call regarding this at [phone].

## 2020-08-21 NOTE — Telephone Encounter (Signed)
Please Advise

## 2020-08-22 NOTE — Telephone Encounter (Signed)
I suggest he takes her to urgent care to have her fully evaluated, Needs to make sure she doesn't have an infection or stroke. All I did is switch her from one medicine to the other of the same class.

## 2020-08-22 NOTE — Telephone Encounter (Addendum)
Pt ready for scheduling (please provide pt with info below):  Prescription sent to [facility] Address: [address], [place], [place] [address] Phone: [phone]  Co-pay enrollment: Pt should visit [email or web address] or call [phone] to enroll to the [place].  Once Prolia Co-pay Card is received patients should provide the Rx BIN number and member ID that is printed on the card when filling their prescription. . Pay as little as $25 for each dose of Prolia up to an annual Maximum Benefit of $1,500 per calendar year . Out-of-pocket cost reduced under the medical or pharmacy benefit . Can be applied to deductible, co-insurance, and/or co-pay for Prolia, but not costs associated with office visit . No income eligibility requirement   CO-PAY CARD enrollment  Prolia: 50% co-insurance Admin fee: 50% coinsurance  DEDUCTIBLE: $5000 deductible ($149 met)  PA REQUIRED  PRIMARY MEDICAL BENEFIT DETAILS (PHYSICIAN PURCHASE, OR REFERRAL TO TREATING SITE) COVERAGE AVAILABLE: Yes  COVERAGE DETAILS: For the primary MD Purchase option, Prolia and administration are subject to a $5000 deductible ($149 met), 50% coinsurance, up to a $8150 out of pocket max ($1051.72 met). Referral is not required. We have provided in network benefits only.  AUTHORIZATION REQUIRED: Yes   PA PROCESS DETAILS: Prior authorization is on file (Authorization [id number]) and is valid from 09/17/2020 through 09/17/2021. (Number of injections covered 2). The prior authorization department can be contacted at [phone].

## 2020-08-23 NOTE — Telephone Encounter (Signed)
Called patient's husband and she was seen by her primary doctor on 08/22/2020

## 2020-08-30 ENCOUNTER — Other Ambulatory Visit (HOSPITAL_COMMUNITY): Payer: Self-pay | Admitting: Internal Medicine

## 2020-08-30 MED ORDER — DENOSUMAB 60 MG/ML ~~LOC~~ SOSY: 60.0000 mg | PREFILLED_SYRINGE | Freq: Once | SUBCUTANEOUS | 0 refills | Status: AC

## 2020-08-30 NOTE — Addendum Note (Signed)
Addended by: Jasper Loser on: 08/30/2020 01:25 PM   Modules accepted: Orders

## 2020-09-18 ENCOUNTER — Encounter (HOSPITAL_COMMUNITY): Payer: Self-pay | Admitting: Emergency Medicine

## 2020-09-18 ENCOUNTER — Emergency Department (HOSPITAL_COMMUNITY)
Admission: EM | Admit: 2020-09-18 | Discharge: 2020-09-18 | Discharge status: Against Medical Advice | Payer: 59 | Attending: Emergency Medicine | Admitting: Emergency Medicine

## 2020-09-18 ENCOUNTER — Other Ambulatory Visit: Payer: Self-pay

## 2020-09-18 ENCOUNTER — Emergency Department (HOSPITAL_COMMUNITY): Payer: 59

## 2020-09-18 DIAGNOSIS — R0602 Shortness of breath: Secondary | ICD-10-CM | POA: Diagnosis not present

## 2020-09-18 DIAGNOSIS — R110 Nausea: Secondary | ICD-10-CM | POA: Insufficient documentation

## 2020-09-18 DIAGNOSIS — Z5321 Procedure and treatment not carried out due to patient leaving prior to being seen by health care provider: Secondary | ICD-10-CM | POA: Diagnosis not present

## 2020-09-18 DIAGNOSIS — R609 Edema, unspecified: Secondary | ICD-10-CM | POA: Diagnosis not present

## 2020-09-18 DIAGNOSIS — R635 Abnormal weight gain: Secondary | ICD-10-CM | POA: Diagnosis not present

## 2020-09-18 LAB — BASIC METABOLIC PANEL
Anion gap: 11 (ref 5–15)
BUN: 12 mg/dL (ref 8–23)
CO2: 26 mmol/L (ref 22–32)
Calcium: 8.7 mg/dL — ABNORMAL LOW (ref 8.9–10.3)
Chloride: 97 mmol/L — ABNORMAL LOW (ref 98–111)
Creatinine, Ser: 1.01 mg/dL — ABNORMAL HIGH (ref 0.44–1.00)
GFR, Estimated: 59 mL/min — ABNORMAL LOW (ref >60)
Glucose, Bld: 350 mg/dL — ABNORMAL HIGH (ref 70–99)
Potassium: 3.3 mmol/L — ABNORMAL LOW (ref 3.5–5.1)
Sodium: 134 mmol/L — ABNORMAL LOW (ref 135–145)

## 2020-09-18 LAB — CBC WITH DIFFERENTIAL/PLATELET
Abs Immature Granulocytes: 0.03 10*3/uL (ref 0.00–0.07)
Basophils Absolute: 0 10*3/uL (ref 0.0–0.1)
Basophils Relative: 0 %
Eosinophils Absolute: 0 10*3/uL (ref 0.0–0.5)
Eosinophils Relative: 0 %
HCT: 38 % (ref 36.0–46.0)
Hemoglobin: 12.4 g/dL (ref 12.0–15.0)
Immature Granulocytes: 1 %
Lymphocytes Relative: 23 %
Lymphs Abs: 1.5 10*3/uL (ref 0.7–4.0)
MCH: 28.6 pg (ref 26.0–34.0)
MCHC: 32.6 g/dL (ref 30.0–36.0)
MCV: 87.6 fL (ref 80.0–100.0)
Monocytes Absolute: 0.5 10*3/uL (ref 0.1–1.0)
Monocytes Relative: 8 %
Neutro Abs: 4.2 10*3/uL (ref 1.7–7.7)
Neutrophils Relative %: 68 %
Platelets: 173 10*3/uL (ref 150–400)
RBC: 4.34 MIL/uL (ref 3.87–5.11)
RDW: 13.3 % (ref 11.5–15.5)
WBC: 6.2 10*3/uL (ref 4.0–10.5)
nRBC: 0 % (ref 0.0–0.2)

## 2020-09-18 LAB — BRAIN NATRIURETIC PEPTIDE: B Natriuretic Peptide: 124.3 pg/mL — ABNORMAL HIGH (ref 0.0–100.0)

## 2020-09-18 NOTE — ED Triage Notes (Signed)
Pt c/o shortness of breath, swelling and weight gain for the last "few days".

## 2020-09-18 NOTE — ED Notes (Signed)
Patients husband states he is a [employer], broadcasting/film/video and he will take patient home and if it gets worse then he will bring her back

## 2020-09-18 NOTE — ED Triage Notes (Signed)
Emergency Medicine Provider Triage Evaluation Note  Melissa Kennedy , a 75 y.o. female  was evaluated in triage.  Pt complains of sob, retaining fluid, nausea. Has been gaining weight, feels weak for a couple of days.   Review of Systems  Positive: Sob, nausea Negative: CP, abdominal pain   Physical Exam  There were no vitals taken for this visit. Gen:   Awake, no distress   HEENT:  Atraumatic  Resp:  Normal effort Cardiac:  Normal rate  Abd:   Nondistended, nontender  MSK:   Moves extremities without difficulty  Neuro:  Speech clear   Medical Decision Making  Medically screening exam initiated at 6:38 PM.  Appropriate orders placed.  Melissa Kennedy was informed that the remainder of the evaluation will be completed by another provider, this initial triage assessment does not replace that evaluation, and the importance of remaining in the ED until their evaluation is complete.  Clinical Impression  MSE was initiated and I personally evaluated the patient and placed orders (if any) at  6:43 PM on September 18, 2020.  The patient appears stable so that the remainder of the MSE may be completed by another provider.    Alfredia Client, PA-C 09/18/20 1843

## 2020-10-03 ENCOUNTER — Encounter: Payer: Self-pay | Admitting: Internal Medicine

## 2020-10-03 ENCOUNTER — Other Ambulatory Visit: Payer: Self-pay

## 2020-10-03 ENCOUNTER — Ambulatory Visit: Payer: 59 | Admitting: Internal Medicine

## 2020-10-03 VITALS — BP 126/84 | HR 84 | Ht 66.0 in | Wt 181.5 lb

## 2020-10-03 DIAGNOSIS — M8000XS Age-related osteoporosis with current pathological fracture, unspecified site, sequela: Secondary | ICD-10-CM | POA: Diagnosis not present

## 2020-10-03 DIAGNOSIS — E2740 Unspecified adrenocortical insufficiency: Secondary | ICD-10-CM | POA: Diagnosis not present

## 2020-10-03 DIAGNOSIS — E559 Vitamin D deficiency, unspecified: Secondary | ICD-10-CM | POA: Diagnosis not present

## 2020-10-03 DIAGNOSIS — E1165 Type 2 diabetes mellitus with hyperglycemia: Secondary | ICD-10-CM

## 2020-10-03 LAB — BASIC METABOLIC PANEL
BUN: 19 mg/dL (ref 6–23)
CO2: 30 mEq/L (ref 19–32)
Calcium: 9.9 mg/dL (ref 8.4–10.5)
Chloride: 101 mEq/L (ref 96–112)
Creatinine, Ser: 1.01 mg/dL (ref 0.40–1.20)
GFR: 55.51 mL/min — ABNORMAL LOW (ref >60.00)
Glucose, Bld: 201 mg/dL — ABNORMAL HIGH (ref 70–99)
Potassium: 3.9 mEq/L (ref 3.5–5.1)
Sodium: 141 mEq/L (ref 135–145)

## 2020-10-03 LAB — POCT GLYCOSYLATED HEMOGLOBIN (HGB A1C): Hemoglobin A1C: 8.8 % — AB (ref 4.0–5.6)

## 2020-10-03 LAB — MAGNESIUM: Magnesium: 1 mg/dL — CL (ref 1.5–2.5)

## 2020-10-03 LAB — POCT GLUCOSE (DEVICE FOR HOME USE): POC Glucose: 181 mg/dl — AB (ref 70–99)

## 2020-10-03 LAB — VITAMIN D 25 HYDROXY (VIT D DEFICIENCY, FRACTURES): VITD: 31.73 ng/mL (ref 30.00–100.00)

## 2020-10-03 NOTE — Patient Instructions (Addendum)
Prolia Assistance:   Prescription sent to [facility] Address: [address], [place], [place] [address] Phone: [phone]  Co-pay enrollment: Pt should visit [email or web address] or call [phone] to enroll to the [place].  Once Prolia Co-pay Card is received patients should provide the Rx BIN number and member ID that is printed on the card when filling their prescription.    FOR ADRENAL: - Continue  Hydrocortisone 10 mg, ONE AND HALF tablets with Breakfast and HALF a tablet between 2-4 PM   ADRENAL INSUFFICIENCY SICK DAY RULES:  Should you face an extreme emotional or physical stress such as trauma, surgery or acute illness, this will require extra steroid coverage so that the body can meet that stress.   Without increasing the steroid dose you may experience severe weakness, headache, dizziness, nausea and vomiting and possibly a more serious deterioration in health.  Typically the dose of steroids will only need to be increased for a couple of days if you have an illness that is transient and managed in the community.   If you are unable to take/absorb an increased dose of steroids orally because of vomiting or diarrhea, you will urgently require steroid injections and should present to an Emergency Department.  The general advice for any serious illness is as follows: 1. Double the normal daily steroid dose for up to 3 days if you have a temperature of more than 37.50C (99.43F) with signs of sickness, or severe emotional or physical distress 2. Contact your primary care doctor and Endocrinologist if the illness worsens or it lasts for more than 3 days.  3. In cases of severe illness, urgent medical assistance should be promptly sought. 4. If you experience vomiting/diarrhea or are unable to take steroids by mouth, please administer the Hydrocortisone injection kit and seek urgent medical help.     FOR DIABETES:  - Increase Metformin  500 mg, 2 tablets with Breakfast and 1 tablet with Supper for a week, then increase to 2 tablets with Breakfast and 2 tablets with supper  - Continue  Lantus 16 units daily      -HOW TO TREAT LOW BLOOD SUGARS (Blood sugar LESS THAN 70 MG/DL)  Please follow the RULE OF 15 for the treatment of hypoglycemia treatment (when your (blood sugars are less than 70 mg/dL)    STEP 1: Take 15 grams of carbohydrates when your blood sugar is low, which includes:   3-4 GLUCOSE TABS  OR  3-4 OZ OF JUICE OR REGULAR SODA OR  ONE TUBE OF GLUCOSE GEL     STEP 2: RECHECK blood sugar in 15 MINUTES STEP 3: If your blood sugar is still low at the 15 minute recheck --> then, go back to STEP 1 and treat AGAIN with another 15 grams of carbohydrates.

## 2020-10-03 NOTE — Progress Notes (Signed)
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 She has followed with [facility] since 08/08/2020 for consultative assistance with management of her adrenal insufficiency.   HISTORICAL SUMMARY:   She was noted to have orthostatic hypotension during hospitalization for left humeral fracture (03/2020) with BP as low as 70/50 mmhg. She was diagnosed with adrenal insufficiency with a serum cortisol of 1.4 ug/dL and started on dexamethasone  At the time.   In review of her records she had a serum glucose of 161 mg/dL, , Na 142 mmol/L and K 4.8 mmol/L with normal GFR during the ED visit  No prior history or glucocorticoid intake  On her initial visit to our clinic we switched dexamethasone to hydrocortisone.    OSTEOPOROSIS HISTORY: She has been diagnosed with Osteoporosis years ago. Was on Fosamax  But was cost prohibitive per pt . She is not on calcium nor vitamin D.  No prior fractures except the humerus in 03/2020  She has severe  heartburn and on PPI  On her initial visit she was found to have low magnesium and Vitamin D which were replenished as well as started Prolia     DIABETES HISTORY: Pt has diabetes since ~ 2017. This has been controlled on Metformin only , with an A1c of 7.0 % per pt  A few months ago ( these records are not available. ) On her initial visit to our clinic she had an A1c of 10.4% . We increased Metformin and started basal insulin   But as noted severe hyperglycemia since being on Dexamethasone    SUBJECTIVE:   Today (10/03/2020):  Melissa Kennedy is here for adrenal insufficiency, DM and  osteoporosis.   Has been checking glucose at home daily. BG's range between 120-200's mg/dL   Has occasional rare diarrhea  Denies nausea or abdominal pain  She continues with fatigue, does not sleep well at night , has frequency and is up every 1-2 every hour.   Has noted LE edema     HOME ENDOCRINE MEDICATIONS: Hydrocortisone 10 mg, 1.5 tabs with Breakfast and half a tablet between 2-4 pm  Calcium 600 mg BID  Metformin 500 mg BID  Lantus 16 units daily Mag chloride 70 mg daily  Ergocalciferol 50,000 iu weekly       HISTORY:  Past Medical History:  Past Medical History:  Diagnosis Date  . AICD (automatic cardioverter/defibrillator) present   . Coronary artery disease   . Hypercholesteremia   . Hypertension   . Myocardial infarction ([place])   . Presence of permanent cardiac pacemaker    Past Surgical History:  Past Surgical History:  Procedure Laterality Date  . ABDOMINAL HYSTERECTOMY    . CHOLECYSTECTOMY    . COLONOSCOPY WITH PROPOFOL N/A 10/14/2016   Procedure: COLONOSCOPY WITH PROPOFOL;  Surgeon: Lollie Sails, MD;  Location: [facility] ENDOSCOPY;  Service: Endoscopy;  Laterality: N/A;  . ESOPHAGOGASTRODUODENOSCOPY (EGD) WITH PROPOFOL N/A 10/14/2016   Procedure: ESOPHAGOGASTRODUODENOSCOPY (EGD) WITH PROPOFOL;  Surgeon: Lollie Sails, MD;  Location[facility];  Service: Endoscopy;  Laterality: N/A;  . GOITER SURGERY    .  INSERTION PACEMAKER/DEFIB    . KIDNEY STONE SURGERY    . TUBAL LIGATION      Social History:  reports that she has never smoked. She has never used smokeless tobacco. She reports that she does not drink alcohol and does not use drugs. Family History:  Family History  Problem Relation Age of Onset  . Breast cancer Neg Hx      HOME MEDICATIONS: Allergies as of 10/03/2020      Reactions   Hydrocodone-acetaminophen Nausea Only   Sulfa Antibiotics Other (See Comments)   UNKNOWN      Medication List       Accurate as of Oct 03, 2020   3:46 PM. If you have any questions, ask your nurse or doctor.        amiodarone 200 MG tablet Commonly known as: PACERONE Take 100 mg by mouth every other day.   aspirin EC 81 MG tablet Take 81 mg by mouth daily.   atorvastatin 20 MG tablet Commonly known as: LIPITOR Take 20 mg by mouth daily.   ergocalciferol 1.25 MG (50000 UT) capsule Commonly known as: VITAMIN D2 Take 1 capsule (50,000 Units total) by mouth once a week.   furosemide 20 MG tablet Commonly known as: LASIX Take 20 mg by mouth daily.   hydrocortisone 10 MG tablet Commonly known as: CORTEF Take 1 and 1/2 (half) tablet with Breakfast and take 1/2 (half) a tablet between 2-4 pm   Insulin Pen Needle 32G X 4 MM Misc 1 Device by Does not apply route daily.   Lantus SoloStar 100 UNIT/ML Solostar Pen Generic drug: insulin glargine Inject 16 Units into the skin daily.   Magnesium Chloride 70 MG Tbec Take 1 tablet (70 mg total) by mouth daily.   metFORMIN 500 MG 24 hr tablet Commonly known as: GLUCOPHAGE-XR Take 1 tablet (500 mg total) by mouth 2 (two) times daily.   metoprolol tartrate 25 MG tablet Commonly known as: LOPRESSOR Take 12.5 mg by mouth 2 (two) times daily.   mupirocin ointment 2 % Commonly known as: BACTROBAN PLEASE SEE ATTACHED FOR DETAILED DIRECTIONS   omeprazole 20 MG capsule Commonly known as: PRILOSEC Take 20 mg by mouth daily.   Prolia 60 MG/ML Sosy injection Generic drug: denosumab INJECT 60 MG INTO THE SKIN ONCE FOR 1 DOSE.   spironolactone 25 MG tablet Commonly known as: ALDACTONE Take 25 mg by mouth daily.         OBJECTIVE:   PHYSICAL EXAM: VS: BP 126/84   Pulse 84   Ht 5\' 6"  (1.676 m)   Wt 181 lb 8 oz (82.3 kg)   SpO2 98%   BMI 29.29 kg/m    EXAM: General: Pt appears well and is in NAD  Lungs: Clear with good BS bilat with no rales, rhonchi, or wheezes  Heart: Auscultation: RRR.  Abdomen: Normoactive bowel sounds, soft, nontender, without masses or  organomegaly palpable  Extremities:  BL LE: trace pretibial edema   Mental Status: Judgment, insight: Intact Orientation: Oriented to time, place, and person Mood and affect: No depression, anxiety, or agitation     DATA REVIEWED: Results for Melissa Kennedy, Kennedy (MRN [id number]) as of 10/04/2020 13:30  Ref. Range 10/03/2020 11:25  Sodium Latest Ref Range: 135 - 145 mEq/L 141  Potassium Latest Ref Range: 3.5 - 5.1 mEq/L 3.9  Chloride Latest Ref Range: 96 - 112 mEq/L 101  CO2 Latest Ref Range: 19 - 32 mEq/L 30  Glucose Latest Ref Range: 70 -  99 mg/dL 201 (H)  BUN Latest Ref Range: 6 - 23 mg/dL 19  Creatinine Latest Ref Range: 0.40 - 1.20 mg/dL 1.01  Calcium Latest Ref Range: 8.4 - 10.5 mg/dL 9.9  Magnesium Latest Ref Range: 1.5 - 2.5 mg/dL 1.0 (LL)  GFR Latest Ref Range: >60.00 mL/min 55.51 (L)  VITD Latest Ref Range: 30.00 - 100.00 ng/mL 31.73      ASSESSMENT / PLAN / RECOMMENDATIONS:   1. Adrenal Insufficiency   - This was based on hypotension and low serum cortisol . She was on Dexamethasone for 5 months before switching her to [facility] in 07/2020 - She was advised to obtain a medical alert bracelet  - Discussed sick day rule  - BMP shows normal electrolytes and stable GFR  Medications : Hydrocortisone 10 mg, 1.5 tabs with Breakfast and half a tablet between 2-4 pm     2. Osteoporosis    - Osteoporosis with fragility fracture  - She was encouraged to start calcium and Vitamin D  - Was on Fosamax in the past, would like to avoid due to GERD - I have recommended PTH analogs as the best option for osteoporosis and fragility fracture but she declines as daily injections would cause hardship on her. - We agreed to try prolia, has high deductible , a letter was mailed to them but they are not sure what it said. I copied and pasted instruction on how to obtain pt assistance and where to pick up  It up from  Medications : - Continue Calcium 600 mg BID  - Ergocalciferol 50,000  weekly  - Prolia 60 mg [place] Q 6 months     3.Type 2 Diabetes Mellitus, Poorly controlled- Most recent A1c of 8.8 %. Goal A1c < 7.0%.      - A1c down from 10.4 %  - I have praised her on improved glycemic control, - Will increase Metformin gradually, titration instructions provided   Medications : Increase Metformin 500 mg , 2 tabs BID  Continue  Lantus 16 units daily      4. Hypomagnsemia :   -She assured me that she has been taking magnesium chloride daily, repeat results show severe hypomagnesemia.  Most likely this is caused by diuretic use we will increase the dose as below   Will increase mag chloride 70 mg twice daily  5. Vitamin D deficiency :  -Vitamin D levels are normal we will continue as below   Continue ergocalciferol 50,000 iu weekly      Signed electronically by: Mack Guise, MD  [facility] Endocrinology  [facility] [address] [place], [place] [address] Phone: [phone] FAX: [phone]      CC: Fae Pippin [place] [address] Phone: [phone]  Fax: [phone]   Return to Endocrinology clinic as below: Future Appointments  Date Time Provider [place]  02/06/2021  2:20 PM Ahmira Boisselle, Melanie Crazier, MD [id number] None

## 2020-10-04 ENCOUNTER — Telehealth: Payer: Self-pay | Admitting: Internal Medicine

## 2020-10-04 MED ORDER — MAGNESIUM CHLORIDE 70 MG PO TBEC: 70.0000 mg | DELAYED_RELEASE_TABLET | Freq: Two times a day (BID) | ORAL | 1 refills | Status: DC

## 2020-10-04 NOTE — Telephone Encounter (Signed)
Spoken to patient's husband and notified Dr Quin Hoop comments. Verbalized understanding.   Patient's husband wanted know if any these show improve in her for adrenal?

## 2020-10-04 NOTE — Telephone Encounter (Signed)
Message left for patient to return my call.  

## 2020-10-04 NOTE — Telephone Encounter (Signed)
PLease let the pt know that her magnesium is extremely low , please make sure she is taking magnesium tablets , if she is indeed still taking them needs to increase to two tablets a day     Her vitamin D is normal, continue once weekly ergocalciferol   Her calcium is normal, continue calcium tablets  Kidney function and potassium are stable     Thanks  \  Abby Nena Jordan, MD  [facility] Endocrinology  [facility] [address] [place], [place] [address] Phone: [phone] FAX: [phone]

## 2020-10-05 IMAGING — MG DIGITAL SCREENING BILAT W/ TOMO W/ CAD
6 of 10 series · 6 of 30 positions shown · non-contrast
Comparison: Previous exam(s).

CLINICAL DATA: Screening.

EXAM:
DIGITAL SCREENING BILATERAL MAMMOGRAM WITH TOMO AND CAD

[L CC synth-2D]
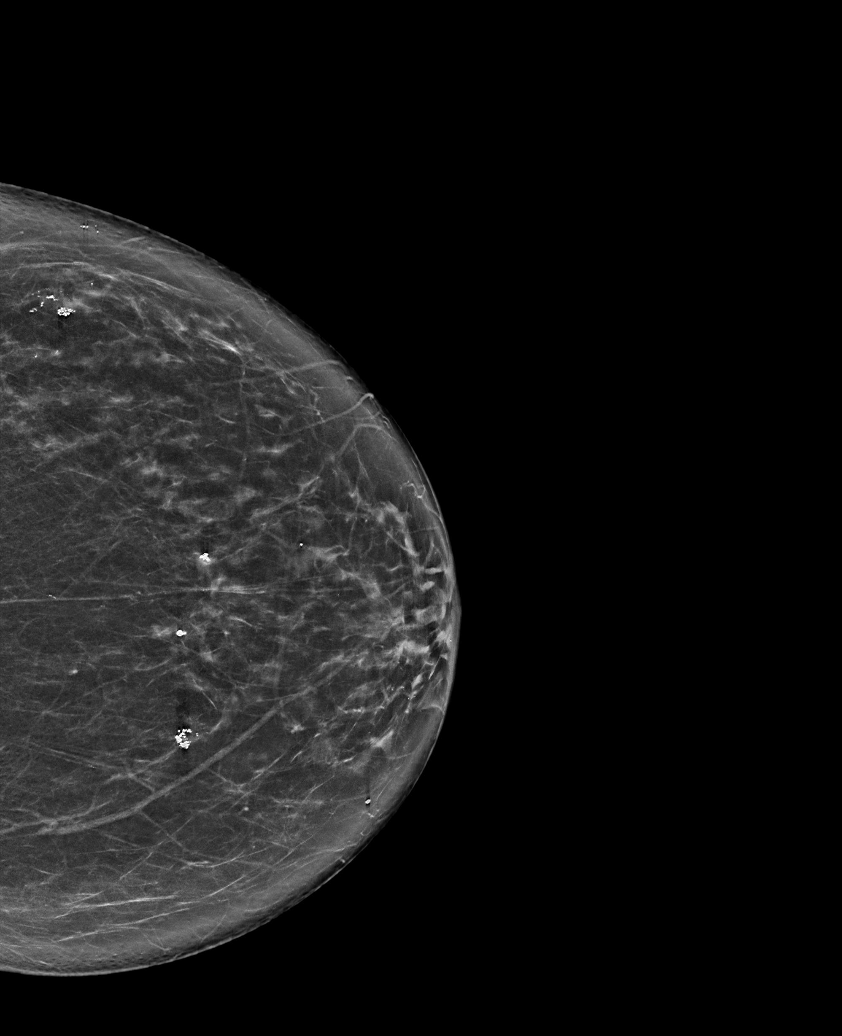

[R CC synth-2D]
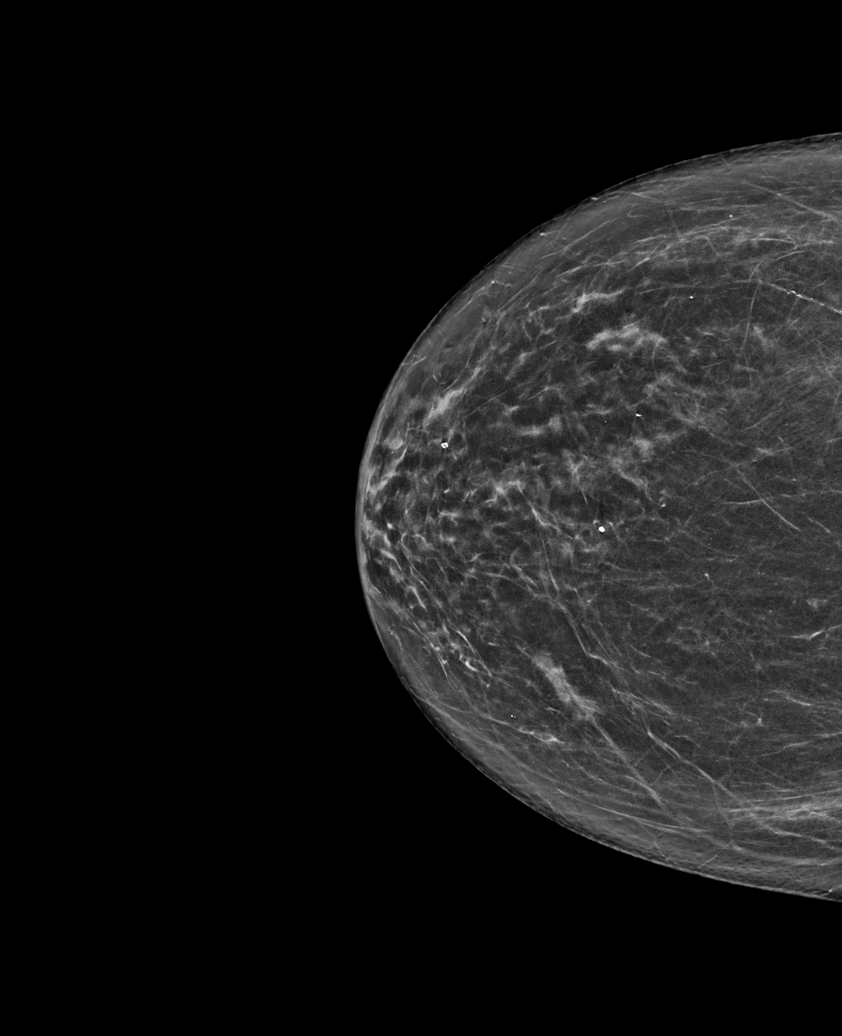

[R MLO synth-2D (1 of 2)]
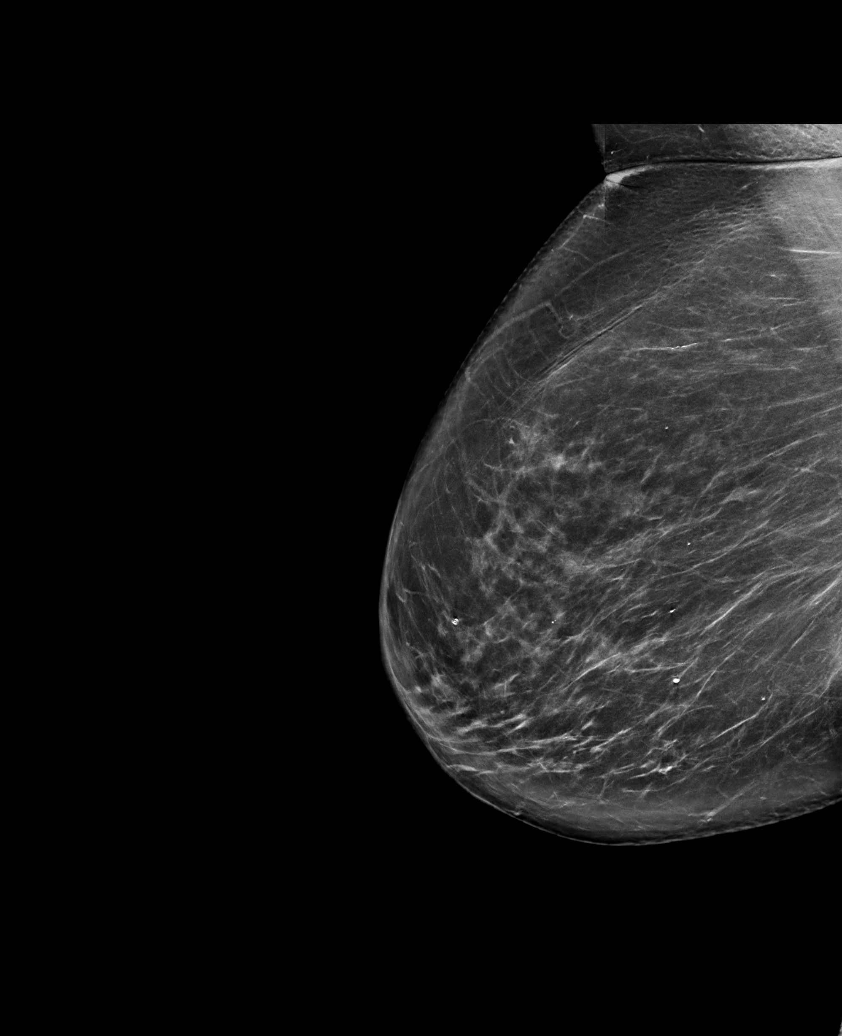

[R MLO synth-2D (2 of 2)]
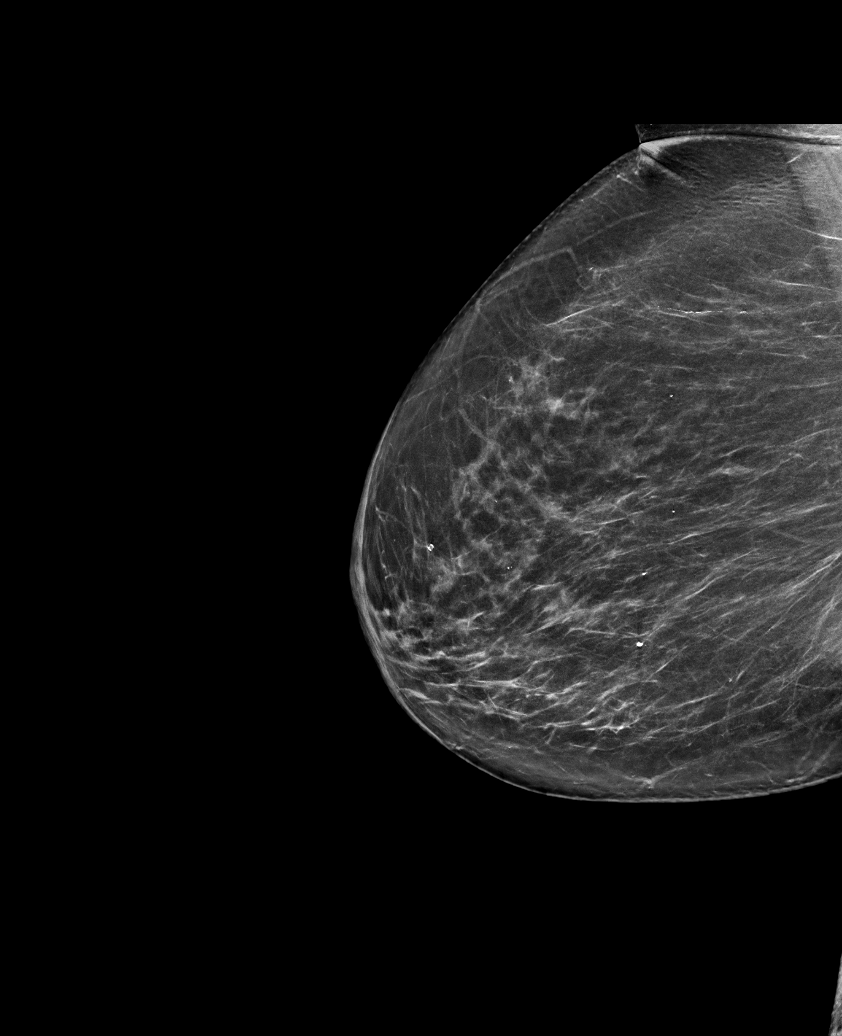

[L MLO synth-2D]
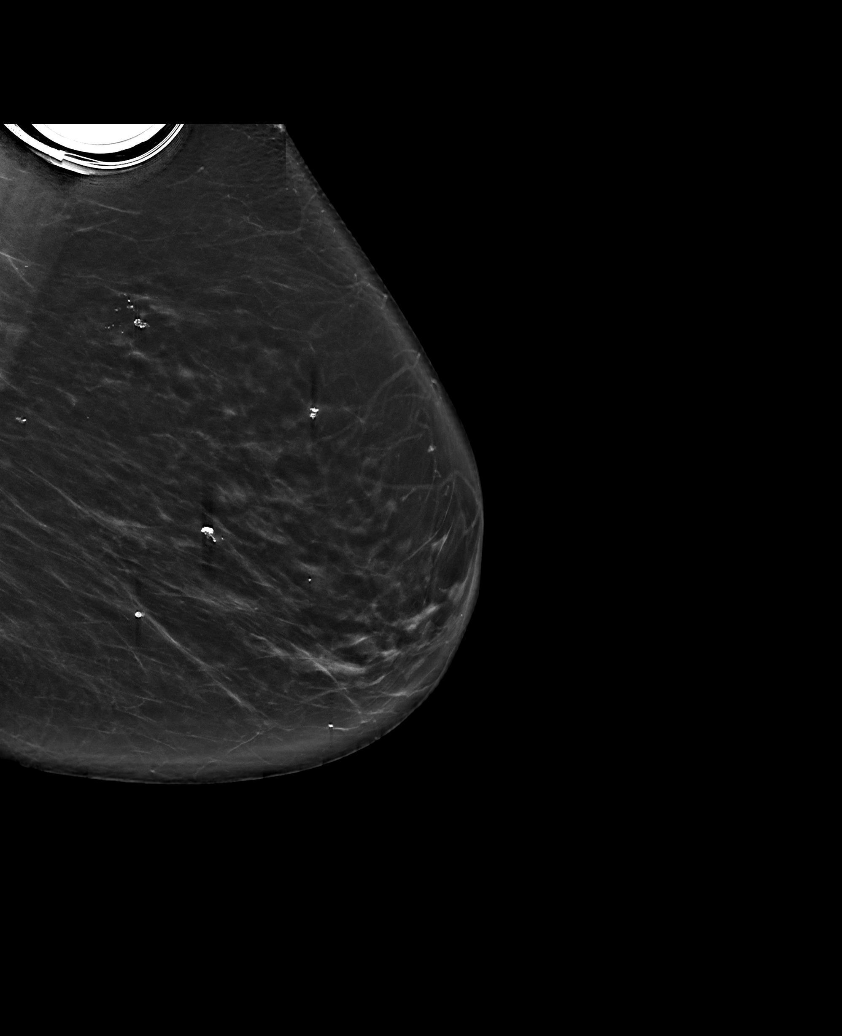

[R MLO tomo · tomo slice 38/75.0]
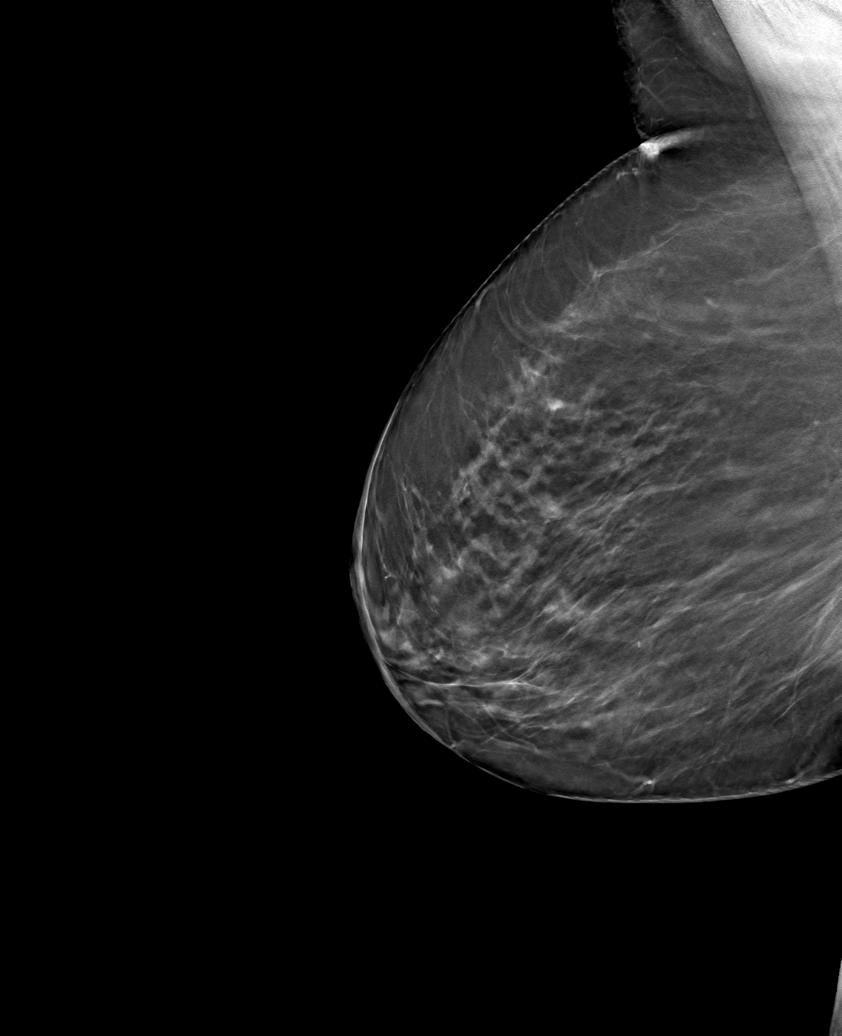

[6 of 30 positions shown; findings below may reference images not displayed]

ACR Breast Density Category b: There are scattered areas of
fibroglandular density.
FINDINGS: There are no findings suspicious for malignancy. Images were
processed with CAD.
IMPRESSION: No mammographic evidence of malignancy. A result letter of this
screening mammogram will be mailed directly to the patient.

RECOMMENDATION:
Screening mammogram in one year. (Code:[id number])

BI-RADS CATEGORY  1: Negative.

## 2020-11-11 NOTE — Telephone Encounter (Signed)
Please follow-up with pt regarding scheduling Prolia inj.   Thanks!

## 2020-11-12 NOTE — Telephone Encounter (Signed)
LMTCB to schedule prolia.

## 2020-11-16 ENCOUNTER — Other Ambulatory Visit: Payer: Self-pay | Admitting: Internal Medicine

## 2020-12-18 NOTE — Telephone Encounter (Signed)
Pt archived in [email or web address].  Please advise if patient and/or provider wish to proceed with Prolia therpay.  

## 2021-01-29 ENCOUNTER — Other Ambulatory Visit: Payer: Self-pay | Admitting: Internal Medicine

## 2021-01-31 ENCOUNTER — Other Ambulatory Visit: Payer: Self-pay | Admitting: Internal Medicine

## 2021-02-06 ENCOUNTER — Telehealth: Payer: Self-pay | Admitting: Internal Medicine

## 2021-02-06 ENCOUNTER — Ambulatory Visit (INDEPENDENT_AMBULATORY_CARE_PROVIDER_SITE_OTHER): Payer: 59 | Admitting: Internal Medicine

## 2021-02-06 ENCOUNTER — Other Ambulatory Visit: Payer: Self-pay

## 2021-02-06 VITALS — BP 118/76 | HR 60 | Ht 66.0 in | Wt 176.6 lb

## 2021-02-06 DIAGNOSIS — E2740 Unspecified adrenocortical insufficiency: Secondary | ICD-10-CM

## 2021-02-06 DIAGNOSIS — E1165 Type 2 diabetes mellitus with hyperglycemia: Secondary | ICD-10-CM

## 2021-02-06 DIAGNOSIS — E559 Vitamin D deficiency, unspecified: Secondary | ICD-10-CM | POA: Diagnosis not present

## 2021-02-06 DIAGNOSIS — M8000XS Age-related osteoporosis with current pathological fracture, unspecified site, sequela: Secondary | ICD-10-CM

## 2021-02-06 LAB — BASIC METABOLIC PANEL
BUN: 28 mg/dL — ABNORMAL HIGH (ref 6–23)
CO2: 27 mEq/L (ref 19–32)
Calcium: 10.3 mg/dL (ref 8.4–10.5)
Chloride: 100 mEq/L (ref 96–112)
Creatinine, Ser: 1.31 mg/dL — ABNORMAL HIGH (ref 0.40–1.20)
GFR: 40.53 mL/min — ABNORMAL LOW (ref >60.00)
Glucose, Bld: 229 mg/dL — ABNORMAL HIGH (ref 70–99)
Potassium: 4.6 mEq/L (ref 3.5–5.1)
Sodium: 139 mEq/L (ref 135–145)

## 2021-02-06 LAB — PHOSPHORUS: Phosphorus: 3.9 mg/dL (ref 2.3–4.6)

## 2021-02-06 LAB — POCT GLYCOSYLATED HEMOGLOBIN (HGB A1C): Hemoglobin A1C: 9.9 % — AB (ref 4.0–5.6)

## 2021-02-06 LAB — GLUCOSE, POCT (MANUAL RESULT ENTRY): POC Glucose: 251 mg/dl — AB (ref 70–99)

## 2021-02-06 LAB — VITAMIN D 25 HYDROXY (VIT D DEFICIENCY, FRACTURES): VITD: 43.69 ng/mL (ref 30.00–100.00)

## 2021-02-06 LAB — T4, FREE: Free T4: 0.9 ng/dL (ref 0.60–1.60)

## 2021-02-06 LAB — MAGNESIUM: Magnesium: 1.1 mg/dL — ABNORMAL LOW (ref 1.5–2.5)

## 2021-02-06 LAB — TSH: TSH: 1.96 u[IU]/mL (ref 0.35–5.50)

## 2021-02-06 MED ORDER — LANTUS SOLOSTAR 100 UNIT/ML ~~LOC~~ SOPN: 20.0000 [IU] | PEN_INJECTOR | Freq: Every day | SUBCUTANEOUS | 3 refills | Status: DC

## 2021-02-06 NOTE — Telephone Encounter (Signed)
Melissa Kennedy ,  Can you please add this pt to the Reclast list ?   She has hx of compression fracture. She declined forteo/tymlos and agreed to prolia but its so expensive and has not been able to afford it.    Thanks

## 2021-02-06 NOTE — Patient Instructions (Signed)
FOR ADRENAL: - Continue  Hydrocortisone 10 mg, ONE AND HALF tablets with Breakfast and HALF a tablet between 2-4 PM   ADRENAL INSUFFICIENCY SICK DAY RULES:  Should you face an extreme emotional or physical stress such as trauma, surgery or acute illness, this will require extra steroid coverage so that the body can meet that stress.   Without increasing the steroid dose you may experience severe weakness, headache, dizziness, nausea and vomiting and possibly a more serious deterioration in health.  Typically the dose of steroids will only need to be increased for a couple of days if you have an illness that is transient and managed in the community.   If you are unable to take/absorb an increased dose of steroids orally because of vomiting or diarrhea, you will urgently require steroid injections and should present to an Emergency Department.  The general advice for any serious illness is as follows: Double the normal daily steroid dose for up to 3 days if you have a temperature of more than 37.50C (99.22F) with signs of sickness, or severe emotional or physical distress Contact your primary care doctor and Endocrinologist if the illness worsens or it lasts for more than 3 days.  In cases of severe illness, urgent medical assistance should be promptly sought. If you experience vomiting/diarrhea or are unable to take steroids by mouth, please administer the Hydrocortisone injection kit and seek urgent medical help.     FOR DIABETES:  - Continue  Metformin 500 mg, 1 tablet with Breakfast and 1 tablet with Supper - Increase  Lantus to 20  units daily     -HOW TO TREAT LOW BLOOD SUGARS (Blood sugar LESS THAN 70 MG/DL) Please follow the RULE OF 15 for the treatment of hypoglycemia treatment (when your (blood sugars are less than 70 mg/dL)   STEP 1: Take 15 grams of carbohydrates when your blood sugar is low, which includes:  3-4 GLUCOSE TABS  OR 3-4 OZ OF JUICE OR REGULAR SODA OR ONE TUBE  OF GLUCOSE GEL    STEP 2: RECHECK blood sugar in 15 MINUTES STEP 3: If your blood sugar is still low at the 15 minute recheck --> then, go back to STEP 1 and treat AGAIN with another 15 grams of carbohydrates.

## 2021-02-06 NOTE — Progress Notes (Signed)
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 She has followed with [facility] since 08/08/2020 for consultative assistance with management of her adrenal insufficiency.   HISTORICAL SUMMARY:   She was noted to have orthostatic hypotension during hospitalization for left humeral fracture (03/2020) with BP as low as 70/50 mmhg. She was diagnosed with adrenal insufficiency with a serum cortisol of 1.4 ug/dL and started on dexamethasone  At the time.    In review of her records she had a serum glucose of 161 mg/dL, , Na 142 mmol/L and K 4.8 mmol/L with normal GFR during the ED visit  No prior history or glucocorticoid intake  On her initial visit to our clinic we switched dexamethasone to hydrocortisone.       OSTEOPOROSIS HISTORY: She has been diagnosed with Osteoporosis years ago. Was on Fosamax  But was cost prohibitive per pt . She is not on calcium nor vitamin D.  No prior fractures except the humerus in 03/2020   She has severe  heartburn and on PPI  On her initial visit she was found to have low magnesium and Vitamin D which were replenished as well as started Prolia       DIABETES HISTORY: Pt has diabetes since ~ 2017. This has been controlled on Metformin only , with an A1c of 7.0 % per pt  A few months ago ( these records are not available. ) On her initial visit to our clinic she had an A1c of 10.4% . We increased Metformin and started basal insulin    But as noted severe hyperglycemia since being on Dexamethasone    SUBJECTIVE:   Today (02/06/2021):  Melissa Kennedy is here for adrenal insufficiency, DM and  osteoporosis.   Has been checking glucose at home daily. Denies hypoglycemic episodes. Has noted hyperglycemia   Developed diarrhea to higher doses of metformin so reduced the dose per PCp's advise  Continues with heartburn   She has been noted with weakness and lack of motivation , depression and unsteadiness. Her PCP advised her to proceed with PT      HOME ENDOCRINE MEDICATIONS: Hydrocortisone 10 mg, 1.5 tabs with Breakfast and half a tablet between 2-4 pm  Calcium 600 mg BID  Metformin 500 mg BID  Lantus 16 units daily Mag chloride 70 mg daily  Ergocalciferol 50,000 iu weekly       HISTORY:  Past Medical History:  Past Medical History:  Diagnosis Date   AICD (automatic cardioverter/defibrillator) present    Coronary artery disease    Hypercholesteremia    Hypertension    Myocardial infarction ([place])    Presence of permanent cardiac pacemaker    Past Surgical History:  Past Surgical History:  Procedure Laterality Date   ABDOMINAL HYSTERECTOMY     CHOLECYSTECTOMY     COLONOSCOPY WITH PROPOFOL N/A 10/14/2016   Procedure: COLONOSCOPY WITH PROPOFOL;  Surgeon: Lollie Sails, MD;  [facility] ENDOSCOPY;  Service: Endoscopy;  Laterality: N/A;   ESOPHAGOGASTRODUODENOSCOPY (EGD) WITH PROPOFOL N/A 10/14/2016   Procedure: ESOPHAGOGASTRODUODENOSCOPY (EGD) WITH PROPOFOL;  Surgeon: Lollie Sails, MD;  Location:  [place] ENDOSCOPY;  Service: Endoscopy;  Laterality: N/A;   GOITER SURGERY     INSERTION PACEMAKER/DEFIB     KIDNEY STONE SURGERY     TUBAL LIGATION     Social History:  reports that she has never smoked. She has never used smokeless tobacco. She reports that she does not drink alcohol and does not use drugs. Family History:  Family History  Problem Relation Age of Onset   Breast cancer Neg Hx      HOME MEDICATIONS: Allergies as of 02/06/2021       Reactions   Hydrocodone-acetaminophen Nausea Only   Sulfa Antibiotics Other (See Comments)   UNKNOWN         Medication List        Accurate as of February 06, 2021  2:31 PM. If you have any questions, ask your nurse or doctor.          amiodarone 200 MG tablet Commonly known as: PACERONE Take 100 mg by mouth every other day.   aspirin EC 81 MG tablet Take 81 mg by mouth daily.   atorvastatin 20 MG tablet Commonly known as: LIPITOR Take 20 mg by mouth daily.   furosemide 20 MG tablet Commonly known as: LASIX Take 40 mg by mouth daily.   hydrocortisone 10 MG tablet Commonly known as: CORTEF TAKE 1 TABLET AS DIRECTED. TAKE 1.5 TABLETS WITH BREAKFAST AND HALF A TABLET BETWEEN 2-4 PM   Insulin Pen Needle 32G X 4 MM Misc 1 Device by Does not apply route daily.   Lantus SoloStar 100 UNIT/ML Solostar Pen Generic drug: insulin glargine Inject 16 Units into the skin daily.   Magnesium Chloride 70 MG Tbec Take 1 tablet (70 mg total) by mouth in the morning and at bedtime.   metFORMIN 500 MG 24 hr tablet Commonly known as: GLUCOPHAGE-XR Take 1 tablet (500 mg total) by mouth 2 (two) times daily.   metoprolol tartrate 25 MG tablet Commonly known as: LOPRESSOR Take 12.5 mg by mouth 2 (two) times daily.   mupirocin ointment 2 % Commonly known as: BACTROBAN PLEASE SEE ATTACHED FOR DETAILED DIRECTIONS   omeprazole 20 MG capsule Commonly known as: PRILOSEC Take 20 mg by mouth daily.   Prolia 60 MG/ML Sosy injection Generic drug: denosumab INJECT 60 MG INTO THE SKIN ONCE FOR 1 DOSE.   RABEprazole 20 MG tablet Commonly known as: ACIPHEX Take 20 mg by mouth daily.   spironolactone 25 MG tablet Commonly known as: ALDACTONE Take 25 mg by mouth daily.   Vitamin D (Ergocalciferol) 1.25 MG (50000 UNIT) Caps capsule Commonly known as: DRISDOL TAKE 1 CAPSULE BY MOUTH ONE TIME PER WEEK          OBJECTIVE:   PHYSICAL EXAM: VS: BP 118/76 (BP Location: Left Arm, Patient Position: Sitting, Cuff Size: Small)   Pulse 60   Ht '5\' 6"'$  (1.676 m)   Wt 176 lb 9.6 oz (80.1 kg)    SpO2 95%   BMI 28.50 kg/m    EXAM: General: Pt appears well and is in NAD  Lungs: Clear with good BS bilat with no rales, rhonchi, or wheezes  Heart: Auscultation: RRR.  Abdomen: Normoactive bowel sounds, soft, nontender, without masses or organomegaly palpable  Extremities:  BL LE: trace pretibial edema   Mental Status: Judgment, insight: Intact Orientation: Oriented to time, place, and person Mood and affect: No depression, anxiety, or agitation     DATA REVIEWED:  Results for Melissa Kennedy, Melissa Kennedy (MRN [id number]) as of  02/08/2021 12:11  Ref. Range 02/06/2021 14:55  Sodium Latest Ref Range: 135 - 145 mEq/L 139  Potassium Latest Ref Range: 3.5 - 5.1 mEq/L 4.6  Chloride Latest Ref Range: 96 - 112 mEq/L 100  CO2 Latest Ref Range: 19 - 32 mEq/L 27  Glucose Latest Ref Range: 70 - 99 mg/dL 229 (H)  BUN Latest Ref Range: 6 - 23 mg/dL 28 (H)  Creatinine Latest Ref Range: 0.40 - 1.20 mg/dL 1.31 (H)  Calcium Latest Ref Range: 8.4 - 10.5 mg/dL 10.3  Phosphorus Latest Ref Range: 2.3 - 4.6 mg/dL 3.9  Magnesium Latest Ref Range: 1.5 - 2.5 mg/dL 1.1 (L)  GFR Latest Ref Range: >60.00 mL/min 40.53 (L)  VITD Latest Ref Range: 30.00 - 100.00 ng/mL 43.69  FSH Latest Units: mIU/ML 64.9  Prolactin Latest Units: ng/mL 6.9  TSH Latest Ref Range: 0.35 - 5.50 uIU/mL 1.96  T4,Free(Direct) Latest Ref Range: 0.60 - 1.60 ng/dL 0.90   ASSESSMENT / PLAN / RECOMMENDATIONS:   Adrenal Insufficiency    - This was based on hypotension and low serum cortisol . She was on Dexamethasone for 5 months before switching her to [facility] in 07/2020 - She was advised to obtain a medical alert bracelet again today  - Discussed sick day rule  -BMP shows normal potassium, and sodium    Medications : Hydrocortisone 10 mg, 1.5 tabs with Breakfast and half a tablet between 2-4 pm        2. Osteoporosis      - Osteoporosis with fragility fracture  - She was encouraged to start calcium and Vitamin D  - Was on Fosamax in  the past, would like to avoid due to GERD - I have recommended PTH analogs as the best option for osteoporosis and fragility fracture but she declines as daily injections would cause hardship on her. - We agreed to try prolia, but this was cost prohibitive , will proceed with Zoledronic acid     Medications : - Continue Calcium 600 mg BID  - Ergocalciferol 50,000 weekly  - Zoledronic Acid 5 mg IV annually    3.Type 2 Diabetes Mellitus, Poorly controlled- Most recent A1c of 9.9 %. Goal A1c < 7.0%.        - Intolerant to higher doses of Metformin  -  Will increase Lantus  - Will consider SGLT-2 inhibitors in the future, she continues with occasional diarrhea will hold off on GLP-1 agonists    Medications : Continue Metformin 500 mg , 2 tabs BID  Increase Lantus 20  units daily       4. Hypomagnsemia :     -Most likely this is caused by diuretic use we will increase the dose as below   Will increase mag chloride 70 mg three times  daily   5. Vitamin D deficiency :    -Vitamin D levels are normal we will continue as below   Continue ergocalciferol 50,000 iu weekly     6.Depression/ Weakness:   -There are attributing this to adrenal insufficiency, I explained to them that she is on physiologic doses of hydrocortisone.  I have strongly urged her to discuss depression medications with her PCP. -She is on 2 different diuretics and could be the reason for her unsteadiness, patient to discuss dose adjustments with her cardiologist/PCP  Signed electronically by: Mack Guise, MD  [facility] Endocrinology  [facility] [address] [place], [place] [address] Phone: [phone] FAX: [phone]  CC: Cyndi Bender, PA-C [place] [address] Phone: [phone]  Fax: [phone]   Return to Endocrinology clinic as below: No future appointments.

## 2021-02-07 LAB — FOLLICLE STIMULATING HORMONE: FSH: 64.9 m[IU]/mL

## 2021-02-07 MED ORDER — MAGNESIUM CHLORIDE 70 MG PO TBEC: 70.0000 mg | DELAYED_RELEASE_TABLET | Freq: Three times a day (TID) | ORAL | 1 refills | Status: DC

## 2021-02-08 ENCOUNTER — Encounter: Payer: Self-pay | Admitting: Internal Medicine

## 2021-02-10 LAB — PROLACTIN: Prolactin: 6.9 ng/mL

## 2021-02-10 LAB — ACTH: C206 ACTH: 7 pg/mL (ref 6–50)

## 2021-02-21 ENCOUNTER — Telehealth: Payer: Self-pay | Admitting: Nutrition

## 2021-02-21 NOTE — Telephone Encounter (Signed)
LVM on home phone to call to schedule reclast infusion

## 2021-02-21 NOTE — Telephone Encounter (Signed)
LVM that I was calling to schedule reclast infusion

## 2021-03-27 ENCOUNTER — Other Ambulatory Visit: Payer: Self-pay | Admitting: Internal Medicine

## 2021-06-01 ENCOUNTER — Other Ambulatory Visit: Payer: Self-pay | Admitting: Internal Medicine

## 2021-06-10 ENCOUNTER — Other Ambulatory Visit: Payer: Self-pay

## 2021-06-10 ENCOUNTER — Telehealth: Payer: Self-pay | Admitting: Internal Medicine

## 2021-06-10 ENCOUNTER — Encounter: Payer: Self-pay | Admitting: Internal Medicine

## 2021-06-10 ENCOUNTER — Ambulatory Visit: Payer: Medicare HMO | Admitting: Internal Medicine

## 2021-06-10 VITALS — BP 120/68 | HR 87 | Ht 65.0 in | Wt 184.6 lb

## 2021-06-10 DIAGNOSIS — M8000XS Age-related osteoporosis with current pathological fracture, unspecified site, sequela: Secondary | ICD-10-CM | POA: Diagnosis not present

## 2021-06-10 DIAGNOSIS — E1165 Type 2 diabetes mellitus with hyperglycemia: Secondary | ICD-10-CM | POA: Diagnosis not present

## 2021-06-10 DIAGNOSIS — E2740 Unspecified adrenocortical insufficiency: Secondary | ICD-10-CM | POA: Diagnosis not present

## 2021-06-10 DIAGNOSIS — E785 Hyperlipidemia, unspecified: Secondary | ICD-10-CM | POA: Diagnosis not present

## 2021-06-10 DIAGNOSIS — E559 Vitamin D deficiency, unspecified: Secondary | ICD-10-CM | POA: Diagnosis not present

## 2021-06-10 DIAGNOSIS — M810 Age-related osteoporosis without current pathological fracture: Secondary | ICD-10-CM

## 2021-06-10 LAB — BASIC METABOLIC PANEL
BUN: 20 mg/dL (ref 6–23)
CO2: 27 mEq/L (ref 19–32)
Calcium: 10.9 mg/dL — ABNORMAL HIGH (ref 8.4–10.5)
Chloride: 102 mEq/L (ref 96–112)
Creatinine, Ser: 1.25 mg/dL — ABNORMAL HIGH (ref 0.40–1.20)
GFR: 42.77 mL/min — ABNORMAL LOW (ref >60.00)
Glucose, Bld: 244 mg/dL — ABNORMAL HIGH (ref 70–99)
Potassium: 4.7 mEq/L (ref 3.5–5.1)
Sodium: 138 mEq/L (ref 135–145)

## 2021-06-10 LAB — MAGNESIUM: Magnesium: 1.4 mg/dL — ABNORMAL LOW (ref 1.5–2.5)

## 2021-06-10 LAB — LIPID PANEL
Cholesterol: 121 mg/dL (ref 0–200)
HDL: 43.4 mg/dL (ref >39.00)
NonHDL: 77.15
Total CHOL/HDL Ratio: 3
Triglycerides: 224 mg/dL — ABNORMAL HIGH (ref 0.0–149.0)
VLDL: 44.8 mg/dL — ABNORMAL HIGH (ref 0.0–40.0)

## 2021-06-10 LAB — TSH: TSH: 1.46 u[IU]/mL (ref 0.35–5.50)

## 2021-06-10 LAB — POCT GLYCOSYLATED HEMOGLOBIN (HGB A1C): Hemoglobin A1C: 9.9 % — AB (ref 4.0–5.6)

## 2021-06-10 LAB — MICROALBUMIN / CREATININE URINE RATIO
Creatinine,U: 248.4 mg/dL
Microalb Creat Ratio: 2.1 mg/g (ref 0.0–30.0)
Microalb, Ur: 5.2 mg/dL — ABNORMAL HIGH (ref 0.0–1.9)

## 2021-06-10 LAB — LDL CHOLESTEROL, DIRECT: Direct LDL: 49 mg/dL

## 2021-06-10 LAB — VITAMIN D 25 HYDROXY (VIT D DEFICIENCY, FRACTURES): VITD: 45.94 ng/mL (ref 30.00–100.00)

## 2021-06-10 MED ORDER — DAPAGLIFLOZIN PROPANEDIOL 5 MG PO TABS: 5.0000 mg | ORAL_TABLET | Freq: Every day | ORAL | 1 refills | Status: DC

## 2021-06-10 NOTE — Patient Instructions (Signed)
° °-   Continue  Metformin 500 mg, 1 tablet with Breakfast and 1 tablet with Supper - Increase  Lantus to 26  units daily  - Start Farxiga 5 mg daily     -HOW TO TREAT LOW BLOOD SUGARS (Blood sugar LESS THAN 70 MG/DL) Please follow the RULE OF 15 for the treatment of hypoglycemia treatment (when your (blood sugars are less than 70 mg/dL)   STEP 1: Take 15 grams of carbohydrates when your blood sugar is low, which includes:  3-4 GLUCOSE TABS  OR 3-4 OZ OF JUICE OR REGULAR SODA OR ONE TUBE OF GLUCOSE GEL    STEP 2: RECHECK blood sugar in 15 MINUTES STEP 3: If your blood sugar is still low at the 15 minute recheck --> then, go back to STEP 1 and treat AGAIN with another 15 grams of carbohydrates.

## 2021-06-10 NOTE — Telephone Encounter (Signed)
Melissa Kennedy,   You called this pt back in 01/2021 to schedule her for reclast but she said she has not been able to get through the lines.    Can you please try to schedule her again.    I stopped by your office but I believe you were gone for the day, figured its easier when she is here   Thanks

## 2021-06-10 NOTE — Progress Notes (Signed)
Name: Melissa Kennedy  MRN/ DOB: [id number], 03-31-1946    Age/ Sex: 76 y.o., female     PCP: Cyndi Bender, Hershal Coria   Reason for Endocrinology Evaluation: Adrenal insufficiency     Initial Endocrinology Clinic Visit: 08/08/2020    PATIENT IDENTIFIER: 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 She has followed with [facility] since 08/08/2020 for consultative assistance with management of her adrenal insufficiency.   HISTORICAL SUMMARY:   She was noted to have orthostatic hypotension during hospitalization for left humeral fracture (03/2020) with BP as low as 70/50 mmhg. She was diagnosed with adrenal insufficiency with a serum cortisol of 1.4 ug/dL and started on dexamethasone  At the time.    In review of her records she had a serum glucose of 161 mg/dL, , Na 142 mmol/L and K 4.8 mmol/L with normal GFR during the ED visit  No prior history or glucocorticoid intake  On her initial visit to our clinic we switched dexamethasone to hydrocortisone.       OSTEOPOROSIS HISTORY: She has been diagnosed with Osteoporosis years ago. Was on Fosamax  But was cost prohibitive per pt . Has hx of humerus # in 03/2020   She has severe  heartburn and on PPI  On her initial visit she was found to have low magnesium and Vitamin D which were replenished. She declined PTH- rp analogues due to daily injections. Prolia was cost prohibitive  I ordered Zoledronic acid and was contacted by our office 01/2021 but she did not schedule       DIABETES HISTORY: Pt has diabetes since ~ 2017. This has been controlled on Metformin only , with an A1c of 7.0 % per pt  A few months ago ( these records are not available. ) On her initial visit to our clinic she had an A1c of 10.4% . We increased Metformin and started basal insulin    But as noted severe hyperglycemia since being on Dexamethasone     SUBJECTIVE:   Today (06/10/2021):  [name] is here for adrenal insufficiency, DM and osteoporosis.   Has been checking glucose at home daily. Denies hypoglycemic episodes. Has noted hyperglycemia    Continues with heartburn  Continues with diarrhea    HOME ENDOCRINE MEDICATIONS: Hydrocortisone 10 mg, 1.5 tabs with Breakfast and half a tablet between 2-4 pm  Calcium 600 mg BID  Metformin 500 mg BID  Lantus 20 units daily Mag chloride 70 mg daily  Ergocalciferol 50,000 iu weekly     GLUCOSE LOG 140- 355 mg/dL         HISTORY:  Past Medical History:  Past Medical History:  Diagnosis Date   AICD (automatic cardioverter/defibrillator) present    Coronary artery disease    Hypercholesteremia    Hypertension    Myocardial infarction ([place])    Presence of permanent cardiac pacemaker    Past Surgical History:  Past Surgical History:  Procedure Laterality Date   ABDOMINAL HYSTERECTOMY     CHOLECYSTECTOMY     COLONOSCOPY WITH PROPOFOL N/A 10/14/2016   Procedure: COLONOSCOPY WITH PROPOFOL;  Surgeon: Lollie Sails, MD;  Location[facility] ENDOSCOPY;  Service: Endoscopy;  Laterality: N/A;   ESOPHAGOGASTRODUODENOSCOPY (EGD) WITH PROPOFOL N/A 10/14/2016   Procedure: ESOPHAGOGASTRODUODENOSCOPY (EGD) WITH PROPOFOL;  Surgeon: Lollie Sails, MD;  Location: [facility] ENDOSCOPY;  Service: Endoscopy;  Laterality: N/A;  GOITER SURGERY     INSERTION PACEMAKER/DEFIB     KIDNEY STONE SURGERY     TUBAL LIGATION     Social History:  reports that she has never smoked. She has never used smokeless tobacco. She reports that she does not drink alcohol and does not use drugs. Family History:  Family History  Problem Relation Age of Onset   Breast cancer Neg Hx      HOME MEDICATIONS: Allergies as of 06/10/2021       Reactions   Hydrocodone-acetaminophen Nausea Only   Sulfa Antibiotics Other (See Comments)   UNKNOWN        Medication List        Accurate as of June 10, 2021 12:35 PM. If you have any questions, ask your nurse or doctor.          amiodarone 200 MG tablet Commonly known as: PACERONE Take 100 mg by mouth every other day.   aspirin EC 81 MG tablet Take 81 mg by mouth daily.   atorvastatin 20 MG tablet Commonly known as: LIPITOR Take 20 mg by mouth daily.   furosemide 20 MG tablet Commonly known as: LASIX Take 40 mg by mouth daily.   hydrocortisone 10 MG tablet Commonly known as: CORTEF TAKE 1.5 TABLETS WITH BREAKFAST AND HALF A TABLET BETWEEN 2-4 PM   Insulin Pen Needle 32G X 4 MM Misc 1 Device by Does not apply route daily.   Lantus SoloStar 100 UNIT/ML Solostar Pen Generic drug: insulin glargine Inject 20 Units into the skin daily.   Magnesium Chloride 70 MG Tbec Take 1 tablet (70 mg total) by mouth 3 (three) times daily.   metFORMIN 500 MG 24 hr tablet Commonly known as: GLUCOPHAGE-XR Take 1 tablet (500 mg total) by mouth 2 (two) times daily.   metoprolol tartrate 25 MG tablet Commonly known as: LOPRESSOR Take 12.5 mg by mouth 2 (two) times daily.   mupirocin ointment 2 % Commonly known as: BACTROBAN PLEASE SEE ATTACHED FOR DETAILED DIRECTIONS   omeprazole 20 MG capsule Commonly known as: PRILOSEC Take 20 mg by mouth daily.   RABEprazole 20 MG tablet Commonly known as: ACIPHEX Take 20 mg by mouth daily.   spironolactone 25 MG tablet Commonly known as: ALDACTONE Take 25 mg by mouth daily.   Vitamin D (Ergocalciferol) 1.25 MG (50000 UNIT) Caps capsule Commonly known as: DRISDOL TAKE 1 CAPSULE BY MOUTH ONE TIME PER WEEK          OBJECTIVE:   PHYSICAL EXAM: VS: BP 120/68 (BP Location: Left Arm, Patient Position: Sitting, Cuff Size: Normal)    Pulse 87    Ht 5\' 5"  (1.651 m)    Wt 184 lb 9.6 oz (83.7 kg)    SpO2 95%    BMI 30.72 kg/m    EXAM: General: Pt appears well and is in NAD  Lungs: Clear with good BS bilat with no rales, rhonchi, or wheezes  Heart: Auscultation: RRR.  Abdomen:  Normoactive bowel sounds, soft, nontender, without masses or organomegaly palpable  Extremities:  BL LE: trace pretibial edema   Mental Status: Judgment, insight: Intact Orientation: Oriented to time, place, and person Mood and affect: No depression, anxiety, or agitation   DM Foot Exam 06/10/2021 The skin of the feet shows fissures at the right great toes with thickened and curved toe nails The pedal pulses are undetectable  on today's exam  The sensation is absent  to a screening 5.07, 10 gram monofilament bilaterally  DATA REVIEWED:  Latest Reference Range & Units 06/10/21 13:46  Sodium 135 - 145 mEq/L 138  Potassium 3.5 - 5.1 mEq/L 4.7  Chloride 96 - 112 mEq/L 102  CO2 19 - 32 mEq/L 27  Glucose 70 - 99 mg/dL 244 (H)  BUN 6 - 23 mg/dL 20  Creatinine 0.40 - 1.20 mg/dL 1.25 (H)  Calcium 8.4 - 10.5 mg/dL 10.9 (H)  Magnesium 1.5 - 2.5 mg/dL 1.4 (L)  GFR >60.00 mL/min 42.77 (L)    Latest Reference Range & Units 06/10/21 13:46  Total CHOL/HDL Ratio  3  Cholesterol 0 - 200 mg/dL 121  HDL Cholesterol >39.00 mg/dL 43.40  Direct LDL mg/dL 49.0  MICROALB/CREAT RATIO 0.0 - 30.0 mg/g 2.1  NonHDL  77.15  Triglycerides 0.0 - 149.0 mg/dL 224.0 (H)  VLDL 0.0 - 40.0 mg/dL 44.8 (H)    Latest Reference Range & Units 06/10/21 13:46  VITD 30.00 - 100.00 ng/mL 45.94    Latest Reference Range & Units 06/10/21 13:46  TSH 0.35 - 5.50 uIU/mL 1.46  Creatinine,U mg/dL 248.4  Microalb, Ur 0.0 - 1.9 mg/dL 5.2 (H)  MICROALB/CREAT RATIO 0.0 - 30.0 mg/g 2.1    ASSESSMENT / PLAN / RECOMMENDATIONS:   Adrenal Insufficiency    - This was based on hypotension and low serum cortisol . She was on Dexamethasone for 5 months before switching her to [facility] in 07/2020 - She was advised to obtain a medical alert bracelet  - Discussed sick day rule  -BMP shows normal potassium, and sodium    Medications : Hydrocortisone 10 mg, 1.5 tabs with Breakfast and half a tablet between 2-4 pm        2.  Osteoporosis      - Osteoporosis with fragility fracture  - Was on Fosamax in the past, would like to avoid due to GERD - I have recommended PTH analogs as the best option for osteoporosis and fragility fracture but she declines as daily injections would cause hardship on her. Prolia was cost prohibitive and were unable to follow through with the pt assistance. I have recommended Reclast , our RN contacted her in 01/2021 without success, per pt she has not been able to get through.  - I am not sure how else I can help them.  - I will send another to our staff to schedule for Rclast     Medications : - Continue Calcium 600 mg BID  - Ergocalciferol 50,000 weekly  - Zoledronic Acid 5 mg IV annually    3.Type 2 Diabetes Mellitus, Poorly controlled- Most recent A1c of 9.9 %. Goal A1c < 7.0%.        - Intolerant to higher doses of Metformin  -  Will increase Lantus  - Discussed starting  SGLT-2 inhibitors cautioned against genital infections  - Not a candidate for GLP-1 agonist due to chronic diarrhea   Medications : Continue Metformin 500 mg , 1 tab BID  Increase Lantus 26  units daily  Start Farxiga 5 mg daily       4. Hypomagnsemia :     -Most likely this is caused by diuretic use , she already has diarrbea , will not increase  - NO change    Continue mag chloride 70 mg three times  daily     5. Vitamin D deficiency :    -Vitamin D levels are normal we will continue as below   Continue ergocalciferol 50,000 iu weekly     6. Dyslipidemia :   -  LDL at goal  - Tg elevated, will encourage low fat and low carb diet   Continue Atorvastatin 20 mg daily     Signed electronically by: Mack Guise, MD  [facility]  [address] [place], [place] [address] Phone: [phone] FAX: [phone]      CC: [address] [place] [address] Phone: [phone]  Fax:  [phone]   Return to Endocrinology clinic as below: Future Appointments  Date Time Provider Central  06/10/2021  2:00 PM Esta Carmon, Melanie Crazier, MD [id number] None

## 2021-06-11 ENCOUNTER — Encounter: Payer: Self-pay | Admitting: Internal Medicine

## 2021-06-11 DIAGNOSIS — E785 Hyperlipidemia, unspecified: Secondary | ICD-10-CM | POA: Insufficient documentation

## 2021-06-11 MED ORDER — MAGNESIUM CHLORIDE 70 MG PO TBEC: 70.0000 mg | DELAYED_RELEASE_TABLET | Freq: Three times a day (TID) | ORAL | 1 refills | Status: AC

## 2021-06-12 ENCOUNTER — Telehealth: Payer: Self-pay | Admitting: Nutrition

## 2021-06-12 NOTE — Telephone Encounter (Signed)
Message left to call me to schedule reclast infusion.  Telephone number given directly to my desk

## 2021-06-12 NOTE — Telephone Encounter (Signed)
LVM with times and dates for next week to do reclast infusion.

## 2021-06-12 NOTE — Telephone Encounter (Signed)
Vm left for patient to callback and ask to speak with Leonia Reader to schedule reclast infusion

## 2021-06-21 DIAGNOSIS — F325 Major depressive disorder, single episode, in full remission: Secondary | ICD-10-CM | POA: Diagnosis not present

## 2021-06-21 DIAGNOSIS — I4721 Torsades de pointes: Secondary | ICD-10-CM | POA: Diagnosis not present

## 2021-06-21 DIAGNOSIS — E1149 Type 2 diabetes mellitus with other diabetic neurological complication: Secondary | ICD-10-CM | POA: Diagnosis not present

## 2021-06-21 DIAGNOSIS — E7800 Pure hypercholesterolemia, unspecified: Secondary | ICD-10-CM | POA: Diagnosis not present

## 2021-06-21 DIAGNOSIS — I10 Essential (primary) hypertension: Secondary | ICD-10-CM | POA: Diagnosis not present

## 2021-06-21 DIAGNOSIS — Z794 Long term (current) use of insulin: Secondary | ICD-10-CM | POA: Diagnosis not present

## 2021-06-21 DIAGNOSIS — Z9181 History of falling: Secondary | ICD-10-CM | POA: Diagnosis not present

## 2021-06-21 DIAGNOSIS — E2740 Unspecified adrenocortical insufficiency: Secondary | ICD-10-CM | POA: Diagnosis not present

## 2021-06-21 DIAGNOSIS — Z139 Encounter for screening, unspecified: Secondary | ICD-10-CM | POA: Diagnosis not present

## 2021-06-21 DIAGNOSIS — I2510 Atherosclerotic heart disease of native coronary artery without angina pectoris: Secondary | ICD-10-CM | POA: Diagnosis not present

## 2021-06-27 ENCOUNTER — Other Ambulatory Visit: Payer: Self-pay

## 2021-06-27 ENCOUNTER — Encounter: Payer: Self-pay | Admitting: Podiatry

## 2021-06-27 ENCOUNTER — Ambulatory Visit: Payer: Medicare HMO | Admitting: Podiatry

## 2021-06-27 DIAGNOSIS — E1169 Type 2 diabetes mellitus with other specified complication: Secondary | ICD-10-CM

## 2021-06-27 DIAGNOSIS — B351 Tinea unguium: Secondary | ICD-10-CM

## 2021-06-27 DIAGNOSIS — E1142 Type 2 diabetes mellitus with diabetic polyneuropathy: Secondary | ICD-10-CM

## 2021-07-02 DIAGNOSIS — I429 Cardiomyopathy, unspecified: Secondary | ICD-10-CM | POA: Diagnosis not present

## 2021-07-02 DIAGNOSIS — Z79899 Other long term (current) drug therapy: Secondary | ICD-10-CM | POA: Diagnosis not present

## 2021-07-02 DIAGNOSIS — Z95810 Presence of automatic (implantable) cardiac defibrillator: Secondary | ICD-10-CM | POA: Diagnosis not present

## 2021-07-02 DIAGNOSIS — I2510 Atherosclerotic heart disease of native coronary artery without angina pectoris: Secondary | ICD-10-CM | POA: Diagnosis not present

## 2021-07-02 DIAGNOSIS — I469 Cardiac arrest, cause unspecified: Secondary | ICD-10-CM | POA: Diagnosis not present

## 2021-07-02 DIAGNOSIS — I255 Ischemic cardiomyopathy: Secondary | ICD-10-CM | POA: Diagnosis not present

## 2021-07-04 NOTE — Progress Notes (Signed)
°  Subjective:  Patient ID: Melissa Kennedy, female    DOB: Mar 26, 1946,  MRN: [id number]  Chief Complaint  Patient presents with   Nail Problem    Trim nails and the big toenails are cracking open and they did bleed and the nails are curling in and hurts    76 y.o. female presents with the above complaint. History confirmed with patient.   PCP: Cyndi Bender, PA-C  Objective:  Physical Exam: warm, good capillary refill, nail exam onychomycosis of the toenails, no trophic changes or ulcerative lesions. DP pulses palpable, PT pulses palpable, and protective sensation absent   Hemoglobin A1C  Date Value Ref Range Status  06/10/2021 9.9 (A) 4.0 - 5.6 % Final   No images are attached to the encounter.  Assessment:   1. Onychomycosis of multiple toenails with type 2 diabetes mellitus and peripheral neuropathy ([name])    Plan:  Patient was evaluated and treated and all questions answered.  Onychomycosis -Patient is diabetic with a qualifying condition for at risk foot care.  Procedure: Nail Debridement Type of Debridement: manual, sharp debridement. Instrumentation: Nail nipper, rotary burr. Number of Nails: 10 Disposition: Patient tolerated well without iatrogenic injury.  No follow-ups on file.

## 2021-07-05 DIAGNOSIS — Z79899 Other long term (current) drug therapy: Secondary | ICD-10-CM | POA: Diagnosis not present

## 2021-07-23 ENCOUNTER — Other Ambulatory Visit: Payer: Self-pay | Admitting: Internal Medicine

## 2021-07-31 ENCOUNTER — Other Ambulatory Visit: Payer: Self-pay | Admitting: Internal Medicine

## 2021-08-04 ENCOUNTER — Other Ambulatory Visit: Payer: Self-pay | Admitting: Internal Medicine

## 2021-09-30 ENCOUNTER — Ambulatory Visit: Payer: Medicare HMO | Admitting: Podiatry

## 2021-10-03 ENCOUNTER — Encounter: Payer: Self-pay | Admitting: Podiatry

## 2021-10-03 ENCOUNTER — Ambulatory Visit (INDEPENDENT_AMBULATORY_CARE_PROVIDER_SITE_OTHER): Payer: Medicare HMO | Admitting: Podiatry

## 2021-10-03 ENCOUNTER — Other Ambulatory Visit: Payer: Self-pay | Admitting: *Deleted

## 2021-10-03 DIAGNOSIS — E1169 Type 2 diabetes mellitus with other specified complication: Secondary | ICD-10-CM

## 2021-10-03 DIAGNOSIS — B351 Tinea unguium: Secondary | ICD-10-CM

## 2021-10-03 DIAGNOSIS — E1142 Type 2 diabetes mellitus with diabetic polyneuropathy: Secondary | ICD-10-CM | POA: Diagnosis not present

## 2021-10-05 ENCOUNTER — Other Ambulatory Visit: Payer: Self-pay | Admitting: Internal Medicine

## 2021-10-11 ENCOUNTER — Ambulatory Visit (INDEPENDENT_AMBULATORY_CARE_PROVIDER_SITE_OTHER): Payer: Medicare HMO | Admitting: Internal Medicine

## 2021-10-11 ENCOUNTER — Telehealth: Payer: Self-pay | Admitting: Pharmacy Technician

## 2021-10-11 ENCOUNTER — Encounter: Payer: Self-pay | Admitting: Internal Medicine

## 2021-10-11 VITALS — BP 124/82 | HR 87 | Ht 65.0 in | Wt 185.0 lb

## 2021-10-11 DIAGNOSIS — R739 Hyperglycemia, unspecified: Secondary | ICD-10-CM

## 2021-10-11 DIAGNOSIS — M8000XS Age-related osteoporosis with current pathological fracture, unspecified site, sequela: Secondary | ICD-10-CM

## 2021-10-11 DIAGNOSIS — E1165 Type 2 diabetes mellitus with hyperglycemia: Secondary | ICD-10-CM

## 2021-10-11 DIAGNOSIS — E559 Vitamin D deficiency, unspecified: Secondary | ICD-10-CM | POA: Diagnosis not present

## 2021-10-11 DIAGNOSIS — M8000XA Age-related osteoporosis with current pathological fracture, unspecified site, initial encounter for fracture: Secondary | ICD-10-CM | POA: Diagnosis not present

## 2021-10-11 DIAGNOSIS — E2740 Unspecified adrenocortical insufficiency: Secondary | ICD-10-CM

## 2021-10-11 DIAGNOSIS — M810 Age-related osteoporosis without current pathological fracture: Secondary | ICD-10-CM

## 2021-10-11 LAB — ALBUMIN: Albumin: 4.2 g/dL (ref 3.5–5.2)

## 2021-10-11 LAB — BASIC METABOLIC PANEL
BUN: 18 mg/dL (ref 6–23)
CO2: 24 mEq/L (ref 19–32)
Calcium: 9.3 mg/dL (ref 8.4–10.5)
Chloride: 105 mEq/L (ref 96–112)
Creatinine, Ser: 1.33 mg/dL — ABNORMAL HIGH (ref 0.40–1.20)
GFR: 39.61 mL/min — ABNORMAL LOW (ref >60.00)
Glucose, Bld: 301 mg/dL — ABNORMAL HIGH (ref 70–99)
Potassium: 5.1 mEq/L (ref 3.5–5.1)
Sodium: 138 mEq/L (ref 135–145)

## 2021-10-11 LAB — POCT GLYCOSYLATED HEMOGLOBIN (HGB A1C): Hemoglobin A1C: 9.3 % — AB (ref 4.0–5.6)

## 2021-10-11 LAB — VITAMIN D 25 HYDROXY (VIT D DEFICIENCY, FRACTURES): VITD: 42.46 ng/mL (ref 30.00–100.00)

## 2021-10-11 LAB — TSH: TSH: 1.28 u[IU]/mL (ref 0.35–5.50)

## 2021-10-11 MED ORDER — SLOW MAGNESIUM/CALCIUM 70-117 MG PO TBEC: 1.0000 | DELAYED_RELEASE_TABLET | Freq: Three times a day (TID) | ORAL | 3 refills | Status: AC

## 2021-10-11 MED ORDER — VITAMIN D (ERGOCALCIFEROL) 1.25 MG (50000 UNIT) PO CAPS: 50000.0000 [IU] | ORAL_CAPSULE | ORAL | 3 refills | Status: DC

## 2021-10-11 MED ORDER — LANTUS SOLOSTAR 100 UNIT/ML ~~LOC~~ SOPN
32.0000 [IU] | PEN_INJECTOR | Freq: Every day | SUBCUTANEOUS | 6 refills | Status: DC
Start: 2021-10-11 — End: 2022-03-24

## 2021-10-11 NOTE — Patient Instructions (Addendum)
?-   Continue  Metformin 500 mg, 1 tablet with Breakfast and 1 tablet with Supper ?- Increase  Lantus to  32 units daily  ? ? ? ? ?-HOW TO TREAT LOW BLOOD SUGARS (Blood sugar LESS THAN 70 MG/DL) ?Please follow the RULE OF 15 for the treatment of hypoglycemia treatment (when your (blood sugars are less than 70 mg/dL)  ? ?STEP 1: Take 15 grams of carbohydrates when your blood sugar is low, which includes:  ?3-4 GLUCOSE TABS  OR ?3-4 OZ OF JUICE OR REGULAR SODA OR ?ONE TUBE OF GLUCOSE GEL   ? ?STEP 2: RECHECK blood sugar in 15 MINUTES ?STEP 3: If your blood sugar is still low at the 15 minute recheck --> then, go back to STEP 1 and treat AGAIN with another 15 grams of carbohydrates. ? ?  ?

## 2021-10-11 NOTE — Progress Notes (Signed)
? ?Name: Melissa Kennedy  ?MRN/ DOB: [id number], Sep 08, 1945    ?Age/ Sex: 76 y.o., female   ? ? ?PCP: Cyndi Bender, PA-C   ?Reason for Endocrinology Evaluation: Adrenal insufficiency  ?   ?Initial Endocrinology Clinic Visit: 08/08/2020  ? ? 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 She has followed with [facility] since 08/08/2020 for consultative assistance with management of her adrenal insufficiency.  ? ?HISTORICAL SUMMARY:  ? ?She was noted to have orthostatic hypotension during hospitalization for left humeral fracture (03/2020) with BP as low as 70/50 mmhg. She was diagnosed with adrenal insufficiency with a serum cortisol of 1.4 ug/dL and started on dexamethasone  At the time.  ?  ?In review of her records she had a serum glucose of 161 mg/dL, , Na 142 mmol/L and K 4.8 mmol/L with normal GFR during the ED visit  ?No prior history or glucocorticoid intake  ?On her initial visit to our clinic we switched dexamethasone to hydrocortisone.  ?  ?  ? OSTEOPOROSIS HISTORY: ?She has been diagnosed with Osteoporosis years ago. Was on Fosamax  But was cost prohibitive per pt . ?Has hx of humerus # in 03/2020 ?  ?She has severe  heartburn and on PPI  ?On her initial visit she was found to have low magnesium and Vitamin D which were replenished. She declined PTH- rp analogues due to daily injections. Prolia was cost prohibitive  ?I ordered Zoledronic acid and was contacted by our office 01/2021 but she did not schedule  ? ? ?  ? DIABETES HISTORY: ?Pt has diabetes since ~ 2017. This has been controlled on Metformin only , with an A1c of 7.0 % per pt  A few months ago ( these records are not available. ) On her initial visit to our clinic she had an A1c of 10.4% . We increased Metformin and started basal insulin  ?  ?But as noted severe hyperglycemia since being on Dexamethasone   ? ? ?SUBJECTIVE:  ? ?Today (10/11/2021):  Melissa Kennedy is here for adrenal insufficiency, DM and osteoporosis.  ? ?Has been checking glucose at home daily. Denies hypoglycemic episodes.  ? ?She has been out of magnesium for 2 weeks  ?Heartburn is improving  ?She has chronic diarrhea  ?[place] is cost prohibitive  ?Has been having heat intolerance  ? ? ? ?HOME ENDOCRINE MEDICATIONS: ?Hydrocortisone 10 mg, 1.5 tabs with Breakfast and half a tablet between 2-4 pm  ?Calcium 600 mg BID  ?Metformin 500 mg BID  ?Lantus 26 units daily ?Mag chloride 70 mg daily  ?Ergocalciferol 50,000 iu weekly  ? ? ? ?GLUCOSE LOG ?Unable to download  ?  ? ? ? ? ? ?HISTORY:  ?Past Medical History:  ?Past Medical History:  ?Diagnosis Date  ? AICD (automatic cardioverter/defibrillator) present   ? Coronary artery disease   ? Hypercholesteremia   ? Hypertension   ? Myocardial infarction [facility])   ? Presence of permanent cardiac pacemaker   ? ?Past Surgical History:  ?Past Surgical History:  ?Procedure Laterality Date  ? ABDOMINAL HYSTERECTOMY    ? CHOLECYSTECTOMY    ? COLONOSCOPY WITH PROPOFOL N/A 10/14/2016  ? Procedure: COLONOSCOPY WITH PROPOFOL;  Surgeon: Lollie Sails, MD;  Location: [facility] ENDOSCOPY;  Service: Endoscopy;  Laterality: N/A;  ? ESOPHAGOGASTRODUODENOSCOPY (EGD) WITH PROPOFOL N/A 10/14/2016  ? Procedure: ESOPHAGOGASTRODUODENOSCOPY (EGD) WITH  PROPOFOL;  Surgeon: Lollie Sails, MD;  [facility] ENDOSCOPY;  Service: Endoscopy;  Laterality: N/A;  ? GOITER SURGERY    ? INSERTION PACEMAKER/DEFIB    ? KIDNEY STONE SURGERY    ? TUBAL LIGATION    ? ?Social History:  reports that she has never smoked. She has never used smokeless tobacco. She reports that she does not drink alcohol and does not use drugs. ?Family History:  ?Family History  ?Problem Relation Age of Onset  ? Breast cancer Neg Hx   ? ? ? ?HOME MEDICATIONS: ?Allergies as of 10/11/2021   ? ?   Reactions  ? Hydrocodone-acetaminophen Nausea Only  ? Sulfa Antibiotics Other  (See Comments)  ? UNKNOWN  ? ?  ? ?  ?Medication List  ?  ? ?  ? Accurate as of Oct 11, 2021  2:40 PM. If you have any questions, ask your nurse or doctor.  ?  ?  ? ?  ? ?amiodarone 200 MG tablet ?Commonly known as: PACERONE ?Take 0.5 tablets by mouth every other day. ?  ?aspirin EC 81 MG tablet ?Take 81 mg by mouth daily. ?  ?atorvastatin 20 MG tablet ?Commonly known as: LIPITOR ?Take 20 mg by mouth daily. ?  ?dapagliflozin propanediol 5 MG Tabs tablet ?Commonly known as: [place] ?Take 1 tablet (5 mg total) by mouth daily before breakfast. ?  ?DULoxetine 30 MG capsule ?Commonly known as: CYMBALTA ?Take 30 mg by mouth daily. ?  ?furosemide 20 MG tablet ?Commonly known as: LASIX ?Take 40 mg by mouth daily. ?  ?hydrocortisone 10 MG tablet ?Commonly known as: CORTEF ?TAKE 1.5 TABLETS WITH BREAKFAST AND HALF A TABLET BETWEEN 2-4 PM ?  ?Insulin Pen Needle 32G X 4 MM Misc ?1 Device by Does not apply route daily. ?  ?Lantus SoloStar 100 UNIT/ML Solostar Pen ?Generic drug: insulin glargine ?Inject 20 Units into the skin daily. ?  ?Magnesium Chloride 70 MG Tbec ?Take 1 tablet (70 mg total) by mouth 3 (three) times daily. ?  ?metFORMIN 500 MG 24 hr tablet ?Commonly known as: GLUCOPHAGE-XR ?TAKE 1 TABLET BY MOUTH TWICE A DAY ?  ?metoprolol tartrate 25 MG tablet ?Commonly known as: LOPRESSOR ?Take 12.5 mg by mouth 2 (two) times daily. ?  ?omeprazole 20 MG capsule ?Commonly known as: PRILOSEC ?Take 20 mg by mouth daily. ?  ?RABEprazole 20 MG tablet ?Commonly known as: ACIPHEX ?Take 20 mg by mouth daily. ?  ?Slow Magnesium/Calcium 70-117 MG Tbec ?Generic drug: Magnesium Cl-Calcium Carbonate ?Take 1 tablet by mouth 3 (three) times daily. ?  ?spironolactone 25 MG tablet ?Commonly known as: ALDACTONE ?Take 25 mg by mouth daily. ?  ?Vitamin D (Ergocalciferol) 1.25 MG (50000 UNIT) Caps capsule ?Commonly known as: DRISDOL ?TAKE 1 CAPSULE BY MOUTH ONE TIME PER WEEK ?  ? ?  ? ? ? ? ?OBJECTIVE:  ? ?PHYSICAL EXAM: ?VS: BP 124/82 (BP  Location: Left Arm, Patient Position: Sitting, Cuff Size: Small)   Pulse 87   Ht '5\' 5"'$  (1.651 m)   Wt 185 lb (83.9 kg)   SpO2 96%   BMI 30.79 kg/m?  ? ? ?EXAM: ?General: Pt appears well and is in NAD  ?Lungs: Clear with good BS bilat with no rales, rhonchi, or wheezes  ?Heart: Auscultation: RRR.  ?Abdomen: Normoactive bowel sounds, soft, nontender, without masses or organomegaly palpable  ?Extremities:  ?BL LE: trace pretibial edema   ?Mental Status: Judgment, insight: Intact ?Orientation: Oriented to time, place, and person ?Mood and affect: No  depression, anxiety, or agitation  ? ?DM Foot Exam 06/10/2021 ?The skin of the feet shows fissures at the right great toes with thickened and curved toe nails ?The pedal pulses are undetectable  on today's exam  ?The sensation is absent  to a screening 5.07, 10 gram monofilament bilaterally ? ? ?DATA REVIEWED: ? ? Latest Reference Range & Units 10/11/21 15:11  ?Sodium 135 - 145 mEq/L 138  ?Potassium 3.5 - 5.1 mEq/L 5.1  ?Chloride 96 - 112 mEq/L 105  ?CO2 19 - 32 mEq/L 24  ?Glucose 70 - 99 mg/dL 301 (H)  ?BUN 6 - 23 mg/dL 18  ?Creatinine 0.40 - 1.20 mg/dL 1.33 (H)  ?Calcium 8.4 - 10.5 mg/dL 9.3  ?Albumin 3.5 - 5.2 g/dL 4.2  ?GFR >60.00 mL/min 39.61 (L)  ? ? Latest Reference Range & Units 10/11/21 15:11  ?VITD 30.00 - 100.00 ng/mL 42.46  ? ? ? Latest Reference Range & Units 10/11/21 15:11  ?TSH 0.35 - 5.50 uIU/mL 1.28  ? ? ?ASSESSMENT / PLAN / RECOMMENDATIONS:  ? ?Adrenal Insufficiency  ?  ?- This was based on hypotension and low serum cortisol . She was on Dexamethasone for 5 months before switching her to [facility] in 07/2020 ?- She was advised to obtain a medical alert bracelet  ?- Discussed sick day rule  ?-BMP shows normal potassium, and sodium ? ? ? ?Medications : ?Hydrocortisone 10 mg, 1.5 tabs with Breakfast and half a tablet between 2-4 pm  ?  ?  ?  ?2. Osteoporosis  ?  ?  ?- Osteoporosis with fragility fracture  ?- Was on Fosamax in the past, would like to avoid due to  GERD ?- I have recommended PTH analogs as the best option for osteoporosis and fragility fracture but she declines as daily injections would cause hardship on her. Prolia was cost prohibitive and were unable to follow thr

## 2021-10-11 NOTE — Telephone Encounter (Signed)
Auth Submission: NO AUTH NEEDED ?Payer: [employer] MEDICARE ?Medication & CPT/J Code(s) submitted: Reclast (Zolendronic acid) J3489 ?Route of submission (phone, fax, portal): PORTAL ?Auth type: Buy/Bill ?Units/visits requested: X1 DOSE ?Reference number:  ?Approval from: 10/11/21 to 06/01/22  ?

## 2021-10-13 NOTE — Progress Notes (Signed)
?  Subjective:  ?Patient ID: Melissa Kennedy, female    DOB: [birth year],  MRN: [id number] ? ?Melissa Kennedy presents to clinic today for at risk foot care with history of diabetic neuropathy and painful elongated mycotic toenails 1-5 bilaterally which are tender when wearing enclosed shoe gear. Pain is relieved with periodic professional debridement. ? ?New problem(s): None.  ? ?PCP is Cyndi Bender, Hershal Coria , and last visit was June 21, 2021. ? ?Allergies  ?Allergen Reactions  ? Hydrocodone-Acetaminophen Nausea Only  ? Sulfa Antibiotics Other (See Comments)  ?  UNKNOWN  ? ? ?Review of Systems: Negative except as noted in the HPI. ? ?Objective: No changes noted in today's physical examination. ? ?Vascular Examination: ?Palpable pedal pulses b/l LE. No pedal edema b/l. Skin temperature gradient WNL b/l. No varicosities b/l. Pedal hair sparse.. ? ?Dermatological Examination: ?Pedal skin with normal turgor, texture and tone b/l. No open wounds. No interdigital macerations b/l. Toenails 1-5 b/l thickened, discolored, dystrophic with subungual debris. There is pain on palpation to dorsal aspect of nailplates. . ? ?Neurological Examination: ?Protective sensation diminished with 10g monofilament b/l. ? ?Musculoskeletal Examination: ?Muscle strength 5/5 to all LE muscle groups b/l. No pain, crepitus or joint limitation noted with ROM bilateral LE. ? ? ?  Latest Ref Rng & Units 10/11/2021  ?  2:40 PM 06/10/2021  ?  1:18 PM 02/06/2021  ?  2:15 PM  ?Hemoglobin A1C  ?Hemoglobin-A1c 4.0 - 5.6 % 9.3   9.9   9.9    ? ?Assessment/Plan: ?1. Onychomycosis of multiple toenails with type 2 diabetes mellitus and peripheral neuropathy ([place])   ?  ? ?-Patient was evaluated and treated. All patient's and/or POA's questions/concerns answered on today's visit. ?-Continue diabetic foot care principles: inspect feet daily, monitor glucose as recommended by PCP and/or Endocrinologist, and follow prescribed diet per PCP, Endocrinologist and/or  dietician. ?-Patient to continue soft, supportive shoe gear daily. ?-Patient/POA to call should there be question/concern in the interim.  ? ?Return in about 3 months (around 01/03/2022). ? ?Marzetta Board, DPM  ?

## 2021-10-14 LAB — PARATHYROID HORMONE, INTACT (NO CA): PTH: 20 pg/mL (ref 16–77)

## 2021-10-17 DIAGNOSIS — I429 Cardiomyopathy, unspecified: Secondary | ICD-10-CM | POA: Diagnosis not present

## 2021-10-18 DIAGNOSIS — H5203 Hypermetropia, bilateral: Secondary | ICD-10-CM | POA: Diagnosis not present

## 2021-10-18 DIAGNOSIS — H524 Presbyopia: Secondary | ICD-10-CM | POA: Diagnosis not present

## 2021-10-18 DIAGNOSIS — E119 Type 2 diabetes mellitus without complications: Secondary | ICD-10-CM | POA: Diagnosis not present

## 2021-10-23 DIAGNOSIS — R197 Diarrhea, unspecified: Secondary | ICD-10-CM | POA: Diagnosis not present

## 2021-10-24 DIAGNOSIS — A045 Campylobacter enteritis: Secondary | ICD-10-CM | POA: Diagnosis not present

## 2021-10-24 DIAGNOSIS — R197 Diarrhea, unspecified: Secondary | ICD-10-CM | POA: Diagnosis not present

## 2021-10-24 NOTE — Telephone Encounter (Signed)
Opened in error

## 2021-10-25 ENCOUNTER — Ambulatory Visit: Payer: Medicare HMO

## 2021-11-04 ENCOUNTER — Ambulatory Visit (INDEPENDENT_AMBULATORY_CARE_PROVIDER_SITE_OTHER): Payer: Medicare HMO

## 2021-11-04 VITALS — BP 96/59 | HR 72 | Temp 97.9°F | Resp 16 | Ht 66.0 in | Wt 182.4 lb

## 2021-11-04 DIAGNOSIS — M8000XS Age-related osteoporosis with current pathological fracture, unspecified site, sequela: Secondary | ICD-10-CM

## 2021-11-04 DIAGNOSIS — M810 Age-related osteoporosis without current pathological fracture: Secondary | ICD-10-CM | POA: Diagnosis not present

## 2021-11-04 MED ORDER — ACETAMINOPHEN 325 MG PO TABS
650.0000 mg | ORAL_TABLET | Freq: Once | ORAL | Status: AC
  Administered 2021-11-04: 650 mg via ORAL
  Filled 2021-11-04: qty 2

## 2021-11-04 MED ORDER — SODIUM CHLORIDE 0.9 % IV SOLN: INTRAVENOUS | Status: DC

## 2021-11-04 MED ORDER — ZOLEDRONIC ACID 5 MG/100ML IV SOLN
5.0000 mg | Freq: Once | INTRAVENOUS | Status: AC
  Administered 2021-11-04: 5 mg via INTRAVENOUS
  Filled 2021-11-04: qty 100

## 2021-11-04 MED ORDER — DIPHENHYDRAMINE HCL 25 MG PO CAPS
25.0000 mg | ORAL_CAPSULE | Freq: Once | ORAL | Status: AC
  Administered 2021-11-04: 25 mg via ORAL
  Filled 2021-11-04: qty 1

## 2021-11-04 NOTE — Patient Instructions (Signed)

## 2021-11-04 NOTE — Progress Notes (Signed)
Diagnosis: Osteoporosis  Provider:  Marshell Garfinkel, MD  Procedure: Infusion  IV Type: Peripheral, IV Location: R Hand  Reclast (Zolendronic Acid), Dose: 5 mg  Infusion Start Time: 4497  Infusion Stop Time: 1528  Post Infusion IV Care: Observation period completed and Peripheral IV Discontinued  Discharge: Condition: Good, Destination: Home . AVS provided to patient.   Performed by:  Cleophus Molt, RN

## 2021-12-05 ENCOUNTER — Other Ambulatory Visit: Payer: Self-pay | Admitting: Internal Medicine

## 2021-12-20 DIAGNOSIS — F325 Major depressive disorder, single episode, in full remission: Secondary | ICD-10-CM | POA: Diagnosis not present

## 2021-12-20 DIAGNOSIS — I4721 Torsades de pointes: Secondary | ICD-10-CM | POA: Diagnosis not present

## 2021-12-20 DIAGNOSIS — I10 Essential (primary) hypertension: Secondary | ICD-10-CM | POA: Diagnosis not present

## 2021-12-20 DIAGNOSIS — K219 Gastro-esophageal reflux disease without esophagitis: Secondary | ICD-10-CM | POA: Diagnosis not present

## 2021-12-20 DIAGNOSIS — M810 Age-related osteoporosis without current pathological fracture: Secondary | ICD-10-CM | POA: Diagnosis not present

## 2021-12-20 DIAGNOSIS — E1149 Type 2 diabetes mellitus with other diabetic neurological complication: Secondary | ICD-10-CM | POA: Diagnosis not present

## 2021-12-20 DIAGNOSIS — I2510 Atherosclerotic heart disease of native coronary artery without angina pectoris: Secondary | ICD-10-CM | POA: Diagnosis not present

## 2021-12-20 DIAGNOSIS — E7800 Pure hypercholesterolemia, unspecified: Secondary | ICD-10-CM | POA: Diagnosis not present

## 2021-12-20 DIAGNOSIS — E2740 Unspecified adrenocortical insufficiency: Secondary | ICD-10-CM | POA: Diagnosis not present

## 2021-12-23 ENCOUNTER — Other Ambulatory Visit: Payer: Self-pay | Admitting: Physician Assistant

## 2021-12-23 DIAGNOSIS — N644 Mastodynia: Secondary | ICD-10-CM

## 2021-12-31 IMAGING — CR DG CHEST 2V
2 series · 2 of 2 positions shown · non-contrast
Comparison: Chest x-ray 03/19/2020, CT chest 05/04/2011

CLINICAL DATA: Shortness of breath.

EXAM:
CHEST - 2 VIEW

[chest pa]
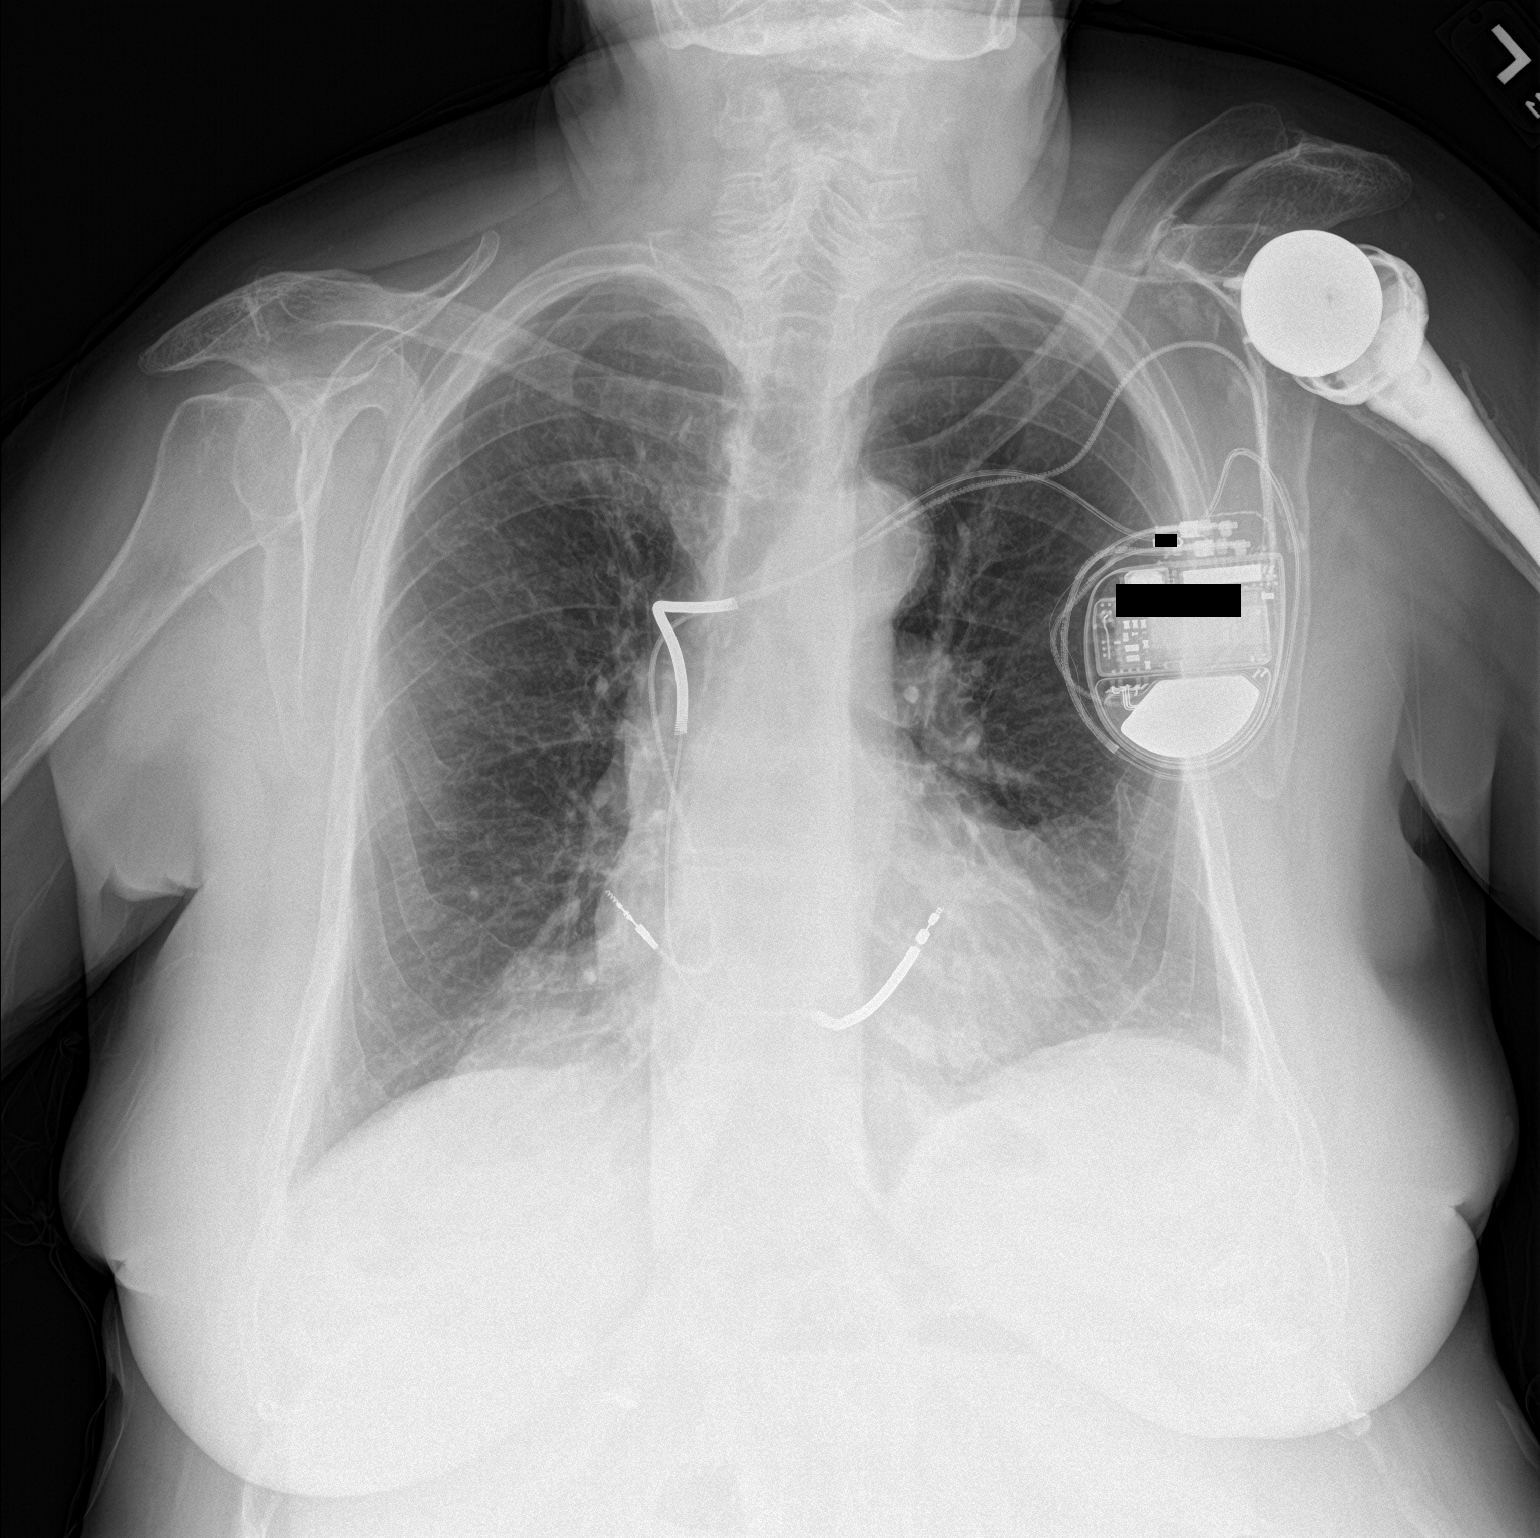

[chest lat]
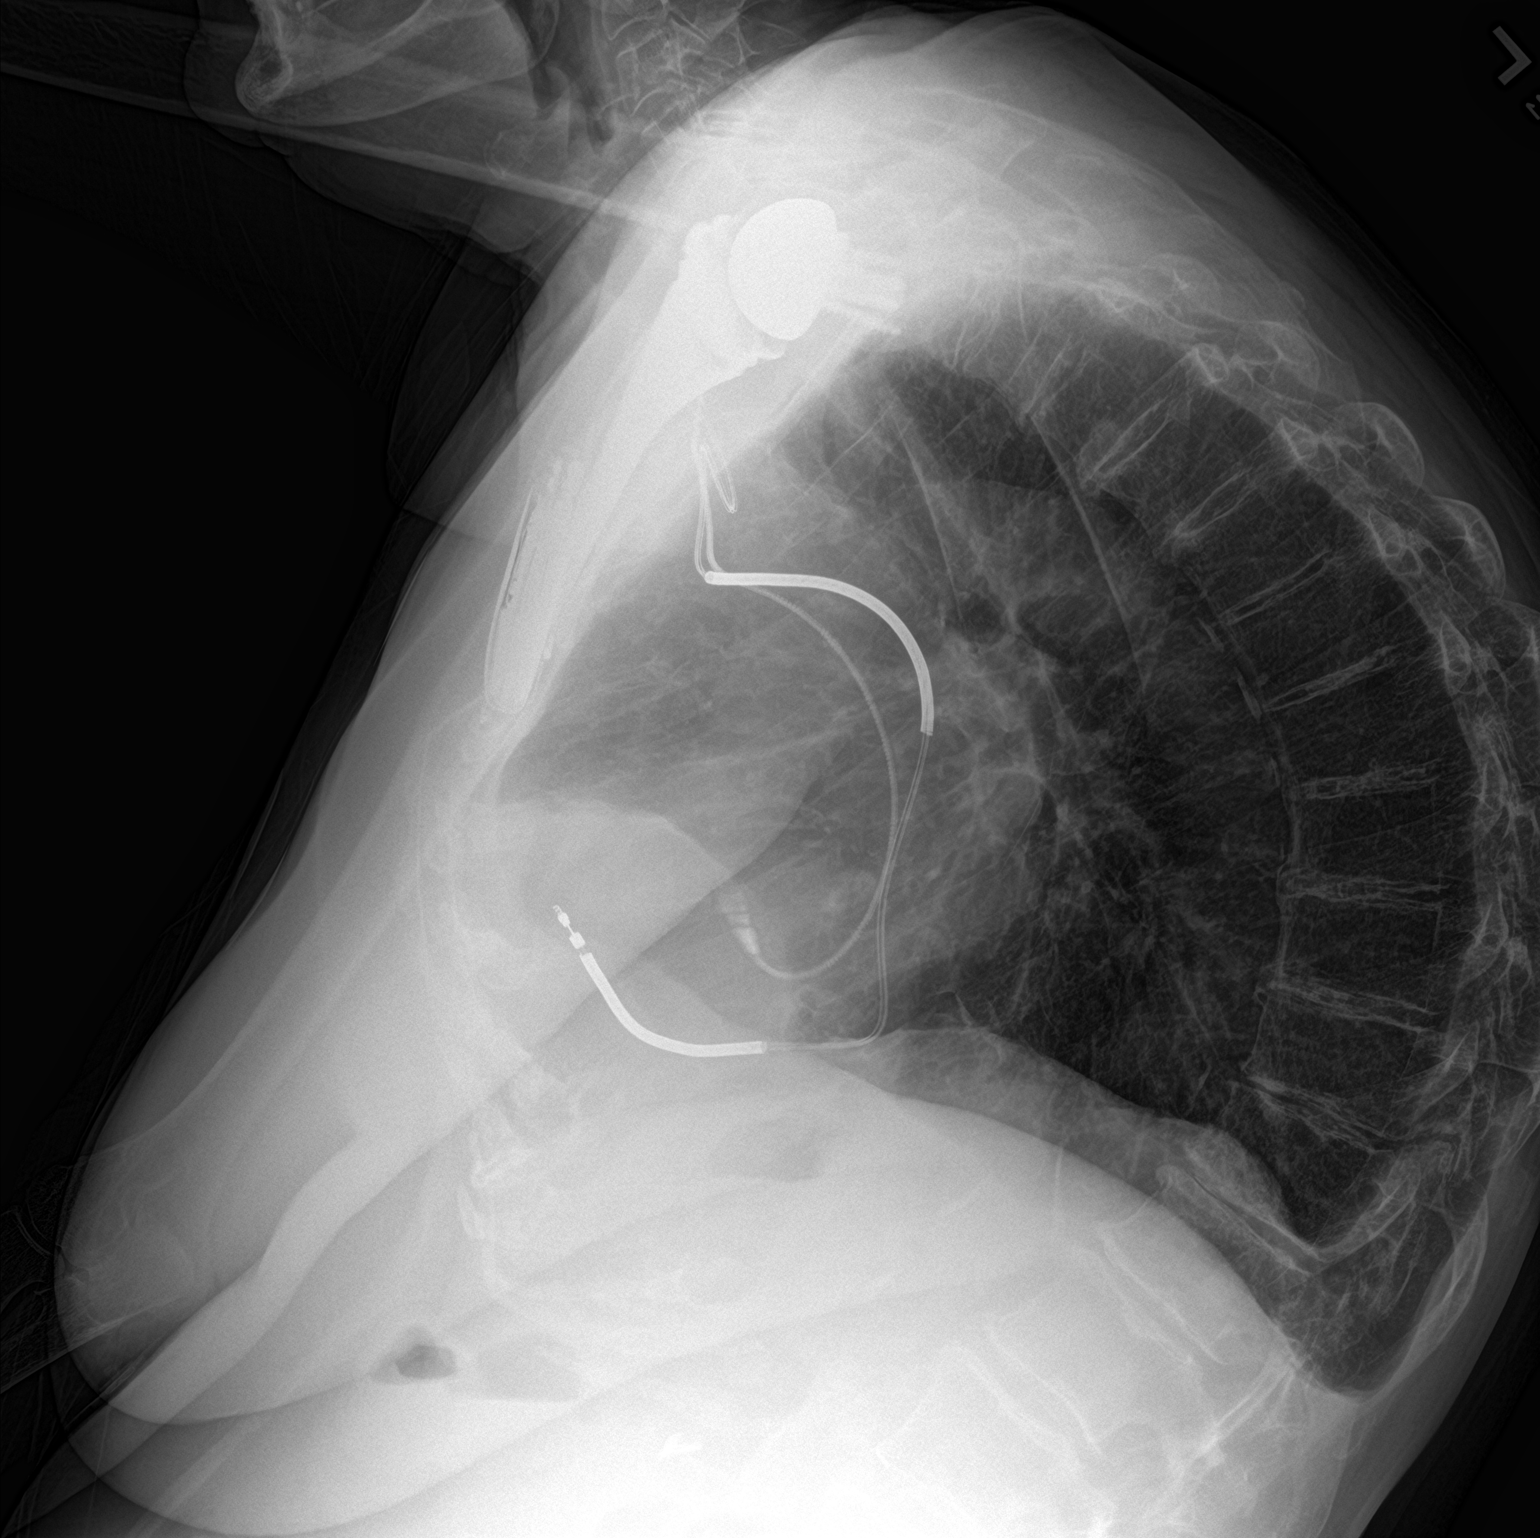

[2 of 2 positions shown; findings below may reference images not displayed]

FINDINGS: Left chest wall 2 lead cardiac pacemaker/defibrillator.

The heart size and mediastinal contours are unchanged. Aortic arch
calcifications.

No focal consolidation. No pulmonary edema. No pleural effusion. No
pneumothorax.

No acute osseous abnormality. Reversed total shoulder arthroplasty
on the left.
IMPRESSION: No active cardiopulmonary disease.

## 2022-01-02 ENCOUNTER — Ambulatory Visit: Payer: Medicare HMO | Admitting: Podiatry

## 2022-01-03 ENCOUNTER — Encounter: Payer: Self-pay | Admitting: Internal Medicine

## 2022-01-09 DIAGNOSIS — I469 Cardiac arrest, cause unspecified: Secondary | ICD-10-CM | POA: Diagnosis not present

## 2022-01-09 DIAGNOSIS — I2510 Atherosclerotic heart disease of native coronary artery without angina pectoris: Secondary | ICD-10-CM | POA: Diagnosis not present

## 2022-01-09 DIAGNOSIS — Z79899 Other long term (current) drug therapy: Secondary | ICD-10-CM | POA: Diagnosis not present

## 2022-01-09 DIAGNOSIS — I429 Cardiomyopathy, unspecified: Secondary | ICD-10-CM | POA: Diagnosis not present

## 2022-01-09 DIAGNOSIS — Z95810 Presence of automatic (implantable) cardiac defibrillator: Secondary | ICD-10-CM | POA: Diagnosis not present

## 2022-01-10 ENCOUNTER — Ambulatory Visit
Admission: RE | Admit: 2022-01-10 | Discharge: 2022-01-10 | Disposition: A | Discharge status: Home / Self Care | Payer: Medicare HMO | Source: Ambulatory Visit | Attending: Physician Assistant | Admitting: Physician Assistant

## 2022-01-10 DIAGNOSIS — N644 Mastodynia: Secondary | ICD-10-CM | POA: Insufficient documentation

## 2022-01-16 ENCOUNTER — Ambulatory Visit (INDEPENDENT_AMBULATORY_CARE_PROVIDER_SITE_OTHER): Payer: Medicare HMO | Admitting: Podiatry

## 2022-01-16 ENCOUNTER — Encounter: Payer: Self-pay | Admitting: Podiatry

## 2022-01-16 DIAGNOSIS — L84 Corns and callosities: Secondary | ICD-10-CM

## 2022-01-16 DIAGNOSIS — M79675 Pain in left toe(s): Secondary | ICD-10-CM | POA: Diagnosis not present

## 2022-01-16 DIAGNOSIS — E1169 Type 2 diabetes mellitus with other specified complication: Secondary | ICD-10-CM

## 2022-01-16 DIAGNOSIS — M79674 Pain in right toe(s): Secondary | ICD-10-CM

## 2022-01-16 DIAGNOSIS — E1165 Type 2 diabetes mellitus with hyperglycemia: Secondary | ICD-10-CM

## 2022-01-16 DIAGNOSIS — E119 Type 2 diabetes mellitus without complications: Secondary | ICD-10-CM

## 2022-01-16 DIAGNOSIS — B351 Tinea unguium: Secondary | ICD-10-CM | POA: Diagnosis not present

## 2022-01-25 NOTE — Progress Notes (Signed)
ANNUAL DIABETIC FOOT EXAM  Subjective: Melissa Kennedy presents today for annual diabetic foot examination.  Patient confirms h/o diabetes.  Patient relates 5 year h/o diabetes.  Patient denies any h/o foot wounds.  Patient has been diagnosed with neuropathy.  Patient's blood sugar was 253 mg/dl today. Last known  HgA1c was 9.9%   Risk factors: diabetes, h/o MI, HTN, CAD, dyslipidemia.  Cyndi Bender, PA-C is patient's PCP. Last visit was June 21, 2021.  Past Medical History:  Diagnosis Date   AICD (automatic cardioverter/defibrillator) present    Coronary artery disease    Hypercholesteremia    Hypertension    Myocardial infarction [facility])    Presence of permanent cardiac pacemaker    Patient Active Problem List   Diagnosis Date Noted   Dyslipidemia 06/11/2021   Vitamin D deficiency 10/03/2020   Hypomagnesemia 10/03/2020   Type 2 diabetes mellitus with hyperglycemia, without long-term current use of insulin ([place]) 08/08/2020   Adrenal insufficiency ([place]) 08/08/2020   Age-related osteoporosis with current pathological fracture 08/08/2020   Status post reverse total replacement of left shoulder 04/13/2020   Closed fracture of left proximal humerus 04/04/2020   Overweight 02/22/2016   Coronary artery disease involving native coronary artery of native heart without angina pectoris 05/08/2015   History of sudden cardiac arrest 05/08/2015   Hypokalemia 05/08/2015   ICD (implantable cardioverter-defibrillator) in place 05/08/2015   Past Surgical History:  Procedure Laterality Date   ABDOMINAL HYSTERECTOMY     CHOLECYSTECTOMY     COLONOSCOPY WITH PROPOFOL N/A 10/14/2016   Procedure: COLONOSCOPY WITH PROPOFOL;  Surgeon: Lollie Sails, MD;  Location: [facility] ENDOSCOPY;  Service: Endoscopy;  Laterality: N/A;   ESOPHAGOGASTRODUODENOSCOPY (EGD) WITH PROPOFOL N/A 10/14/2016   Procedure: ESOPHAGOGASTRODUODENOSCOPY (EGD) WITH PROPOFOL;  Surgeon: Lollie Sails, MD;  Location:  [facility] ENDOSCOPY;  Service: Endoscopy;  Laterality: N/A;   GOITER SURGERY     INSERTION PACEMAKER/DEFIB     KIDNEY STONE SURGERY     TUBAL LIGATION     Current Outpatient Medications on File Prior to Visit  Medication Sig Dispense Refill   amiodarone (PACERONE) 200 MG tablet Take 0.5 tablets by mouth every other day.     aspirin EC 81 MG tablet Take 81 mg by mouth daily.     atorvastatin (LIPITOR) 20 MG tablet Take 20 mg by mouth daily.     DULoxetine (CYMBALTA) 30 MG capsule Take 30 mg by mouth daily.     furosemide (LASIX) 20 MG tablet Take 40 mg by mouth daily.     hydrocortisone (CORTEF) 10 MG tablet TAKE 1.5 TABLETS WITH BREAKFAST AND 1/2 A TABLET BETWEEN 2-4 PM 60 tablet 1   insulin glargine (LANTUS SOLOSTAR) 100 UNIT/ML Solostar Pen Inject 32 Units into the skin daily. 30 mL 6   Insulin Pen Needle 32G X 4 MM MISC 1 Device by Does not apply route daily. 50 each 11   Magnesium Chloride 70 MG TBEC Take 1 tablet (70 mg total) by mouth 3 (three) times daily. 270 tablet 1   metFORMIN (GLUCOPHAGE-XR) 500 MG 24 hr tablet TAKE 1 TABLET BY MOUTH TWICE A DAY 180 tablet 3   metoprolol tartrate (LOPRESSOR) 25 MG tablet Take 12.5 mg by mouth 2 (two) times daily.     omeprazole (PRILOSEC) 20 MG capsule Take 20 mg by mouth daily.     RABEprazole (ACIPHEX) 20 MG tablet Take 20 mg by mouth daily.     SLOW MAGNESIUM/CALCIUM 70-117 MG TBEC Take 1 tablet by  mouth 3 (three) times daily. 270 tablet 3   spironolactone (ALDACTONE) 25 MG tablet Take 25 mg by mouth daily.     Vitamin D, Ergocalciferol, (DRISDOL) 1.25 MG (50000 UNIT) CAPS capsule Take 1 capsule (50,000 Units total) by mouth every 7 (seven) days. 13 capsule 3   No current facility-administered medications on file prior to visit.    Allergies  Allergen Reactions   Hydrocodone-Acetaminophen Nausea Only   Sulfa Antibiotics Other (See Comments)    UNKNOWN   Social History   Occupational History   Not on file  Tobacco Use   Smoking status:  Never   Smokeless tobacco: Never  Vaping Use   Vaping Use: Never used  Substance and Sexual Activity   Alcohol use: No   Drug use: No   Sexual activity: Not on file   Family History  Problem Relation Age of Onset   Breast cancer Neg Hx    Immunization History  Administered Date(s) Administered   Influenza-Unspecified 03/02/2018     Review of Systems: Negative except as noted in the HPI.   Objective: There were no vitals filed for this visit.  Melissa Kennedy is a pleasant 76 y.o. female in NAD. AAO X 3. Objective:   Vascular Examination: Vascular status intact b/l with palpable pedal pulses. Pedal hair sparse b/l. CFT <3 seconds b/l. No edema. No pain with calf compression b/l. Skin temperature gradient WNL b/l.  No varicosities b/l.  Neurological Examination:Protective sensation diminished with 10g monofilament b/l.  Dermatological Examination: Pedal skin with normal turgor, texture and tone b/l. Toenails 1-5 b/l thick, discolored, elongated with subungual debris and pain on dorsal palpation. Hyperkeratotic lesion(s) medial IPJ of left great toe and medial IPJ of right great toe.  No erythema, no edema, no drainage, no fluctuance.  Musculoskeletal Examination: Muscle strength 5/5 to b/l LE. No pain, crepitus or joint limitation noted with ROM bilateral LE. No gross bony deformities bilaterally.  Radiographs: None  Last A1c:      Latest Ref Rng & Units 10/11/2021    2:40 PM 06/10/2021    1:18 PM 02/06/2021    2:15 PM  Hemoglobin A1C  Hemoglobin-A1c 4.0 - 5.6 % 9.3  9.9  9.9    Footwear Assessment: Does the patient wear appropriate shoes? Yes. Does the patient need inserts/orthotics? No.  ADA Risk Categorization: High Risk  Patient has one or more of the following: Loss of protective sensation Absent pedal pulses Severe Foot deformity History of foot ulcer  Assessment: 1. Onychomycosis of multiple toenails with type 2 diabetes mellitus and peripheral neuropathy  (HCC)   2. Callus   3. Type 2 diabetes mellitus with hyperglycemia, without long-term current use of insulin ([place])   4. Encounter for diabetic foot exam ([place])   Plan: -Diabetic foot examination performed today. -Continue foot and shoe inspections daily. Monitor blood glucose per PCP/Endocrinologist's recommendations. -Mycotic toenails 1-5 bilaterally were debrided in length and girth with sterile nail nippers and dremel without incident. -Callus(es) medial IPJ of left great toe and medial IPJ of right great toe pared utilizing sterile scalpel blade without complication or incident. Total number debrided =2. -Patient/POA to call should there be question/concern in the interim. Return in about 3 months (around 04/18/2022).  Marzetta Board, DPM

## 2022-01-27 ENCOUNTER — Other Ambulatory Visit: Payer: Self-pay | Admitting: Internal Medicine

## 2022-03-21 ENCOUNTER — Other Ambulatory Visit: Payer: Self-pay | Admitting: Internal Medicine

## 2022-03-27 ENCOUNTER — Other Ambulatory Visit: Payer: Self-pay | Admitting: Internal Medicine

## 2022-04-14 DIAGNOSIS — R1110 Vomiting, unspecified: Secondary | ICD-10-CM | POA: Diagnosis not present

## 2022-04-14 DIAGNOSIS — E118 Type 2 diabetes mellitus with unspecified complications: Secondary | ICD-10-CM | POA: Diagnosis not present

## 2022-04-14 DIAGNOSIS — I10 Essential (primary) hypertension: Secondary | ICD-10-CM | POA: Diagnosis not present

## 2022-04-14 DIAGNOSIS — E860 Dehydration: Secondary | ICD-10-CM | POA: Diagnosis not present

## 2022-04-14 DIAGNOSIS — Z20822 Contact with and (suspected) exposure to covid-19: Secondary | ICD-10-CM | POA: Diagnosis not present

## 2022-04-14 DIAGNOSIS — N390 Urinary tract infection, site not specified: Secondary | ICD-10-CM | POA: Diagnosis not present

## 2022-04-14 DIAGNOSIS — R350 Frequency of micturition: Secondary | ICD-10-CM | POA: Diagnosis not present

## 2022-04-14 DIAGNOSIS — M6281 Muscle weakness (generalized): Secondary | ICD-10-CM | POA: Diagnosis not present

## 2022-04-16 ENCOUNTER — Ambulatory Visit: Payer: Medicare HMO | Admitting: Internal Medicine

## 2022-05-01 ENCOUNTER — Ambulatory Visit: Payer: Medicare HMO | Admitting: Podiatry

## 2022-05-04 DIAGNOSIS — N390 Urinary tract infection, site not specified: Secondary | ICD-10-CM | POA: Diagnosis not present

## 2022-05-14 DIAGNOSIS — R6889 Other general symptoms and signs: Secondary | ICD-10-CM | POA: Diagnosis not present

## 2022-05-14 DIAGNOSIS — E1149 Type 2 diabetes mellitus with other diabetic neurological complication: Secondary | ICD-10-CM | POA: Diagnosis not present

## 2022-05-14 DIAGNOSIS — U071 COVID-19: Secondary | ICD-10-CM | POA: Diagnosis not present

## 2022-05-14 DIAGNOSIS — R112 Nausea with vomiting, unspecified: Secondary | ICD-10-CM | POA: Diagnosis not present

## 2022-05-14 DIAGNOSIS — J111 Influenza due to unidentified influenza virus with other respiratory manifestations: Secondary | ICD-10-CM | POA: Diagnosis not present

## 2022-05-15 ENCOUNTER — Ambulatory Visit: Payer: Medicare HMO | Admitting: Podiatry

## 2022-05-25 ENCOUNTER — Other Ambulatory Visit: Payer: Self-pay | Admitting: Internal Medicine

## 2022-06-27 DIAGNOSIS — E2740 Unspecified adrenocortical insufficiency: Secondary | ICD-10-CM | POA: Diagnosis not present

## 2022-06-27 DIAGNOSIS — E1149 Type 2 diabetes mellitus with other diabetic neurological complication: Secondary | ICD-10-CM | POA: Diagnosis not present

## 2022-06-27 DIAGNOSIS — F325 Major depressive disorder, single episode, in full remission: Secondary | ICD-10-CM | POA: Diagnosis not present

## 2022-06-27 DIAGNOSIS — E7800 Pure hypercholesterolemia, unspecified: Secondary | ICD-10-CM | POA: Diagnosis not present

## 2022-06-27 DIAGNOSIS — I4721 Torsades de pointes: Secondary | ICD-10-CM | POA: Diagnosis not present

## 2022-06-27 DIAGNOSIS — K219 Gastro-esophageal reflux disease without esophagitis: Secondary | ICD-10-CM | POA: Diagnosis not present

## 2022-06-27 DIAGNOSIS — Z23 Encounter for immunization: Secondary | ICD-10-CM | POA: Diagnosis not present

## 2022-06-27 DIAGNOSIS — I2510 Atherosclerotic heart disease of native coronary artery without angina pectoris: Secondary | ICD-10-CM | POA: Diagnosis not present

## 2022-06-27 DIAGNOSIS — I10 Essential (primary) hypertension: Secondary | ICD-10-CM | POA: Diagnosis not present

## 2022-06-27 DIAGNOSIS — Z79899 Other long term (current) drug therapy: Secondary | ICD-10-CM | POA: Diagnosis not present

## 2022-07-05 DIAGNOSIS — I429 Cardiomyopathy, unspecified: Secondary | ICD-10-CM | POA: Diagnosis not present

## 2022-07-07 ENCOUNTER — Ambulatory Visit: Payer: Medicare HMO | Admitting: Internal Medicine

## 2022-07-07 ENCOUNTER — Encounter: Payer: Self-pay | Admitting: Internal Medicine

## 2022-07-07 VITALS — BP 126/70 | HR 81 | Ht 66.0 in | Wt 181.0 lb

## 2022-07-07 DIAGNOSIS — M8000XS Age-related osteoporosis with current pathological fracture, unspecified site, sequela: Secondary | ICD-10-CM

## 2022-07-07 DIAGNOSIS — E559 Vitamin D deficiency, unspecified: Secondary | ICD-10-CM

## 2022-07-07 DIAGNOSIS — E2740 Unspecified adrenocortical insufficiency: Secondary | ICD-10-CM | POA: Diagnosis not present

## 2022-07-07 DIAGNOSIS — E1165 Type 2 diabetes mellitus with hyperglycemia: Secondary | ICD-10-CM

## 2022-07-07 DIAGNOSIS — M25561 Pain in right knee: Secondary | ICD-10-CM | POA: Diagnosis not present

## 2022-07-07 DIAGNOSIS — M25562 Pain in left knee: Secondary | ICD-10-CM | POA: Diagnosis not present

## 2022-07-07 LAB — POCT GLYCOSYLATED HEMOGLOBIN (HGB A1C): Hemoglobin A1C: 8.9 % — AB (ref 4.0–5.6)

## 2022-07-07 MED ORDER — VITAMIN D (ERGOCALCIFEROL) 1.25 MG (50000 UNIT) PO CAPS: 50000.0000 [IU] | ORAL_CAPSULE | ORAL | 3 refills | Status: DC

## 2022-07-07 MED ORDER — METFORMIN HCL ER 500 MG PO TB24: 500.0000 mg | ORAL_TABLET | Freq: Two times a day (BID) | ORAL | 3 refills | Status: DC

## 2022-07-07 MED ORDER — INSULIN PEN NEEDLE 32G X 4 MM MISC: 1.0000 | Freq: Every day | 3 refills | Status: DC

## 2022-07-07 MED ORDER — HYDROCORTISONE 10 MG PO TABS: ORAL_TABLET | ORAL | 3 refills | Status: DC

## 2022-07-07 MED ORDER — LANTUS SOLOSTAR 100 UNIT/ML ~~LOC~~ SOPN: 44.0000 [IU] | PEN_INJECTOR | Freq: Every day | SUBCUTANEOUS | 3 refills | Status: DC

## 2022-07-07 NOTE — Patient Instructions (Addendum)
-   Continue  Metformin 500 mg, 1 tablet with Breakfast and 1 tablet with Supper - Increase   Lantus 44 units daily      -HOW TO TREAT LOW BLOOD SUGARS (Blood sugar LESS THAN 70 MG/DL) Please follow the RULE OF 15 for the treatment of hypoglycemia treatment (when your (blood sugars are less than 70 mg/dL)   STEP 1: Take 15 grams of carbohydrates when your blood sugar is low, which includes:  3-4 GLUCOSE TABS  OR 3-4 OZ OF JUICE OR REGULAR SODA OR ONE TUBE OF GLUCOSE GEL    STEP 2: RECHECK blood sugar in 15 MINUTES STEP 3: If your blood sugar is still low at the 15 minute recheck --> then, go back to STEP 1 and treat AGAIN with another 15 grams of carbohydrates.

## 2022-07-07 NOTE — Progress Notes (Signed)
Name: Melissa Kennedy  MRN/ DOB: [id number], 08-Jul-1945    Age/ Sex: 77 y.o., female     PCP: Cyndi Bender, Hershal Coria   Reason for Endocrinology Evaluation: Adrenal insufficiency     Initial Endocrinology Clinic Visit: 08/08/2020    PATIENT IDENTIFIER: 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 She has followed with [facility] since 08/08/2020 for consultative assistance with management of her adrenal insufficiency.   HISTORICAL SUMMARY:   She was noted to have orthostatic hypotension during hospitalization for left humeral fracture (03/2020) with BP as low as 70/50 mmhg. She was diagnosed with adrenal insufficiency with a serum cortisol of 1.4 ug/dL and started on dexamethasone  At the time.    In review of her records she had a serum glucose of 161 mg/dL, , Na 142 mmol/L and K 4.8 mmol/L with normal GFR during the ED visit  No prior history or glucocorticoid intake  On her initial visit to our clinic we switched dexamethasone to hydrocortisone.       OSTEOPOROSIS HISTORY: She has been diagnosed with Osteoporosis years ago. Was on Fosamax  But was cost prohibitive per pt . Has hx of humerus # in 03/2020   She has severe  heartburn and on PPI  On her initial visit she was found to have low magnesium and Vitamin D which were replenished. She declined PTH- rp analogues due to daily injections. Prolia was cost prohibitive  I ordered Zoledronic acid and was contacted by our office 01/2021 but she did not schedule   She finally received zoledronic acid 11/04/2021    DIABETES HISTORY: Pt has diabetes since ~ 2017. This has been controlled on Metformin only , with an A1c of 7.0 % per pt  A few months ago ( these records are not available. ) On her initial visit to our clinic she had an A1c of 10.4% . We increased Metformin and started basal insulin    But as noted severe  hyperglycemia since being on Dexamethasone    SUBJECTIVE:   Today (07/07/2022):  [name] is here for adrenal insufficiency, DM and osteoporosis.   She follows with cardiology for nonobstructive coronary artery disease and cardiomyopathy She had a follow-up with podiatry 12/2021  She received zoledronic acid on 11/04/2021 without side effects  Chronic diarrhea have resolved  Denies heartburn  She had a fall last week , she has knee pains but denies fracture      HOME ENDOCRINE MEDICATIONS: Hydrocortisone 10 mg, 1.5 tabs with Breakfast and half a tablet between 2-4 pm  Calcium 600 mg BID  Metformin 500 mg BID  Lantus 40 units daily Mag chloride 70 mg BID  Ergocalciferol 50,000 iu weekly     GLUCOSE LOG Unable to download   121-300 mg/dL       HISTORY:  Past Medical History:  Past Medical History:  Diagnosis Date   AICD (automatic cardioverter/defibrillator) present    Coronary artery disease    Hypercholesteremia    Hypertension    Myocardial infarction ([place])    Presence of permanent cardiac pacemaker    Past Surgical History:  Past Surgical History:  Procedure Laterality Date   ABDOMINAL HYSTERECTOMY     CHOLECYSTECTOMY     COLONOSCOPY WITH PROPOFOL N/A 10/14/2016   Procedure: COLONOSCOPY WITH PROPOFOL;  Surgeon: Lollie Sails, MD;  Location: [facility] ENDOSCOPY;  Service:  Endoscopy;  Laterality: N/A;   ESOPHAGOGASTRODUODENOSCOPY (EGD) WITH PROPOFOL N/A 10/14/2016   Procedure: ESOPHAGOGASTRODUODENOSCOPY (EGD) WITH PROPOFOL;  Surgeon: Lollie Sails, MD;  Location: [facility] ENDOSCOPY;  Service: Endoscopy;  Laterality: N/A;   GOITER SURGERY     INSERTION PACEMAKER/DEFIB     KIDNEY STONE SURGERY     TUBAL LIGATION     Social History:  reports that she has never smoked. She has never used smokeless tobacco. She reports that she does not drink alcohol and does not use drugs. Family History:  Family History  Problem Relation Age of Onset   Breast cancer Neg Hx       HOME MEDICATIONS: Allergies as of 07/07/2022       Reactions   Hydrocodone-acetaminophen Nausea Only   Sulfa Antibiotics Other (See Comments)   UNKNOWN        Medication List        Accurate as of July 07, 2022 10:25 AM. If you have any questions, ask your nurse or doctor.          STOP taking these medications    omeprazole 20 MG capsule Commonly known as: PRILOSEC       TAKE these medications    amiodarone 200 MG tablet Commonly known as: PACERONE Take 0.5 tablets by mouth every other day.   aspirin EC 81 MG tablet Take 81 mg by mouth daily.   atorvastatin 20 MG tablet Commonly known as: LIPITOR Take 20 mg by mouth daily.   DULoxetine 30 MG capsule Commonly known as: CYMBALTA Take 30 mg by mouth daily.   furosemide 20 MG tablet Commonly known as: LASIX Take 40 mg by mouth daily.   hydrocortisone 10 MG tablet Commonly known as: CORTEF TAKE 1 AND 1/2 TABLETS WITH BREAKFAST AND 1/2 A TABLET BETWEEN 2-4 PM   Insulin Pen Needle 32G X 4 MM Misc 1 Device by Does not apply route daily.   Lantus SoloStar 100 UNIT/ML Solostar Pen Generic drug: insulin glargine INJECT 20 UNITS INTO THE SKIN DAILY. What changed: how much to take   Magnesium Chloride 70 MG Tbec Take 1 tablet (70 mg total) by mouth 3 (three) times daily.   metFORMIN 500 MG 24 hr tablet Commonly known as: GLUCOPHAGE-XR TAKE 1 TABLET BY MOUTH TWICE A DAY   metoprolol tartrate 25 MG tablet Commonly known as: LOPRESSOR Take 12.5 mg by mouth 2 (two) times daily.   RABEprazole 20 MG tablet Commonly known as: ACIPHEX Take 20 mg by mouth daily.   Slow Magnesium/Calcium 70-117 MG Tbec Generic drug: Magnesium Cl-Calcium Carbonate Take 1 tablet by mouth 3 (three) times daily.   spironolactone 25 MG tablet Commonly known as: ALDACTONE Take 25 mg by mouth daily.   Vitamin D (Ergocalciferol) 1.25 MG (50000 UNIT) Caps capsule Commonly known as: DRISDOL Take 1 capsule (50,000 Units  total) by mouth every 7 (seven) days.          OBJECTIVE:   PHYSICAL EXAM: VS: BP 126/70 (BP Location: Left Arm, Patient Position: Sitting, Cuff Size: Large)   Pulse 81   Ht '5\' 6"'$  (1.676 m)   Wt 181 lb (82.1 kg)   SpO2 95%   BMI 29.21 kg/m    EXAM: General: Pt appears well and is in NAD  Lungs: Clear with good BS bilat with no rales, rhonchi, or wheezes  Heart: Auscultation: RRR.  Extremities:  BL LE: no pretibial edema   Mental Status: Judgment, insight: Intact Orientation: Oriented to time, place, and person  Mood and affect: No depression, anxiety, or agitation   DM Foot Exam 01/16/2022 per  podiatry     DATA REVIEWED:  07/01/2022 BUN 17 Cr 1.08 GFR 54 K 4.7 Na 141  Ca 9.8  TSH 2.120 Tg 156 LDL 51 HDL 36 Cortisol 23.5  A1c 9.1%    ASSESSMENT / PLAN / RECOMMENDATIONS:   Adrenal Insufficiency    - This was based on hypotension and low serum cortisol . She was on Dexamethasone for 5 months before switching her to [facility] in 07/2020 - She was advised to obtain a medical alert bracelet  - Discussed sick day rule in the past  -BMP shows normal potassium, and sodium through PCP's office  - She had an elevated cortisol at PCPs office, I did explain to the patient serum cortisol while on hydrocortisone has no clinical value, as her dose is based on body surface area rather than cortisol levels -No change at this time   Medications : Hydrocortisone 10 mg, 1.5 tabs with Breakfast and half a tablet between 2-4 pm        2. Osteoporosis      - Osteoporosis with fragility fracture  - Was on Fosamax in the past, would like to avoid due to GERD - I have recommended PTH analogs as the best option for osteoporosis and fragility fracture but she declines as daily injections would cause hardship on her. Prolia was cost prohibitive and were unable to follow through with the pt assistance.  -She was able to receive her first zoledronic acid infusion 11/04/2021, without any  side effects -Will reorder by next visit -Emphasized importance of optimizing calcium and vitamin D intake  Medications : - Continue Calcium 600 mg BID  - Ergocalciferol 50,000 weekly  - Continue Zoledronic Acid 5 mg IV annually    3.Type 2 Diabetes Mellitus, Poorly controlled- Most recent A1c of 8.9 %. Goal A1c < 7.0%.    - Intolerant to higher doses of Metformin  - SGLT-2 I have been cost prohibitive  - Not a candidate for GLP-1 agonist due to chronic diarrhea in the past, but this has resolved we will consider in the future -Increase Lantus as below  Medications : Continue Metformin 500 mg , 1 tab BID  Increase Lantus 44   units daily        4. Hypomagnsemia :     -Most likely this is caused by diuretic use and chronic diarrhea in the past  Medication  Continue mag chloride 70 mg two times  daily     5. Vitamin D deficiency :    -Vitamin D levels have been normal in the past   Continue ergocalciferol 50,000 iu weekly      Follow-up in 6 months   Signed electronically by: Mack Guise, MD  [facility]  [address] [place], [place] [address] Phone: [phone] FAX: [phone]      CC: [address] [place] [address] Phone: [phone]  Fax: [phone]   Return to Endocrinology clinic as below: Future Appointments  Date Time Provider Eb[place]  07/07/2022 10:50 AM Dayne Chait, Melanie Crazier, MD [facility] None  07/21/2022  3:45 PM [place], Nena Alexander, DPM [facility]

## 2022-07-21 ENCOUNTER — Ambulatory Visit: Payer: Medicare HMO | Admitting: Podiatry

## 2022-07-21 VITALS — BP 128/68

## 2022-07-21 DIAGNOSIS — E1165 Type 2 diabetes mellitus with hyperglycemia: Secondary | ICD-10-CM

## 2022-07-21 DIAGNOSIS — M79674 Pain in right toe(s): Secondary | ICD-10-CM

## 2022-07-21 DIAGNOSIS — B351 Tinea unguium: Secondary | ICD-10-CM | POA: Diagnosis not present

## 2022-07-21 DIAGNOSIS — M79675 Pain in left toe(s): Secondary | ICD-10-CM | POA: Diagnosis not present

## 2022-07-21 NOTE — Progress Notes (Signed)
  Subjective:  Patient ID: Melissa Kennedy, female    DOB: 1946/05/02,  MRN: [id number]  Chief Complaint  Patient presents with   Nail Problem    Nail trim     77 y.o. female presents with the above complaint. History confirmed with patient. Patient presenting with pain related to dystrophic thickened elongated nails. Patient is unable to trim own nails related to nail dystrophy and/or mobility issues. Patient does  have a history of T2DM. Takes insulin.   Objective:  Physical Exam: warm, good capillary refill nail exam onychomycosis of the toenails, onycholysis, and dystrophic nails DP pulses palpable, PT pulses palpable, and protective sensation diminished Left Foot:  Pain with palpation of nails due to elongation and dystrophic growth.  Right Foot: Pain with palpation of nails due to elongation and dystrophic growth.   Assessment:   1. Pain due to onychomycosis of toenails of both feet   2. Type 2 diabetes mellitus with hyperglycemia, without long-term current use of insulin ([place])      Plan:  Patient was evaluated and treated and all questions answered.   #Onychomycosis with pain  -Nails palliatively debrided as below. -Educated on self-care  Procedure: Nail Debridement Rationale: Pain Type of Debridement: manual, sharp debridement. Instrumentation: Nail nipper, rotary burr. Number of Nails: 10  Return in about 3 months (around 10/19/2022) for [facility].         Everitt Amber, DPM [facility]

## 2022-08-28 DIAGNOSIS — T148XXA Other injury of unspecified body region, initial encounter: Secondary | ICD-10-CM | POA: Diagnosis not present

## 2022-08-28 DIAGNOSIS — L03114 Cellulitis of left upper limb: Secondary | ICD-10-CM | POA: Diagnosis not present

## 2022-08-28 DIAGNOSIS — E118 Type 2 diabetes mellitus with unspecified complications: Secondary | ICD-10-CM | POA: Diagnosis not present

## 2022-08-28 DIAGNOSIS — I10 Essential (primary) hypertension: Secondary | ICD-10-CM | POA: Diagnosis not present

## 2022-09-01 DIAGNOSIS — Z23 Encounter for immunization: Secondary | ICD-10-CM | POA: Diagnosis not present

## 2022-09-26 DIAGNOSIS — E1149 Type 2 diabetes mellitus with other diabetic neurological complication: Secondary | ICD-10-CM | POA: Diagnosis not present

## 2022-09-26 DIAGNOSIS — E7800 Pure hypercholesterolemia, unspecified: Secondary | ICD-10-CM | POA: Diagnosis not present

## 2022-09-26 DIAGNOSIS — M810 Age-related osteoporosis without current pathological fracture: Secondary | ICD-10-CM | POA: Diagnosis not present

## 2022-09-26 DIAGNOSIS — E2740 Unspecified adrenocortical insufficiency: Secondary | ICD-10-CM | POA: Diagnosis not present

## 2022-09-26 DIAGNOSIS — K219 Gastro-esophageal reflux disease without esophagitis: Secondary | ICD-10-CM | POA: Diagnosis not present

## 2022-09-26 DIAGNOSIS — I10 Essential (primary) hypertension: Secondary | ICD-10-CM | POA: Diagnosis not present

## 2022-09-26 DIAGNOSIS — F325 Major depressive disorder, single episode, in full remission: Secondary | ICD-10-CM | POA: Diagnosis not present

## 2022-09-26 DIAGNOSIS — I2510 Atherosclerotic heart disease of native coronary artery without angina pectoris: Secondary | ICD-10-CM | POA: Diagnosis not present

## 2022-09-26 DIAGNOSIS — I4721 Torsades de pointes: Secondary | ICD-10-CM | POA: Diagnosis not present

## 2022-10-04 DIAGNOSIS — I429 Cardiomyopathy, unspecified: Secondary | ICD-10-CM | POA: Diagnosis not present

## 2022-10-21 ENCOUNTER — Ambulatory Visit: Payer: Medicare HMO | Admitting: Podiatry

## 2022-10-21 DIAGNOSIS — M79674 Pain in right toe(s): Secondary | ICD-10-CM

## 2022-10-21 DIAGNOSIS — L84 Corns and callosities: Secondary | ICD-10-CM

## 2022-10-21 DIAGNOSIS — B351 Tinea unguium: Secondary | ICD-10-CM | POA: Diagnosis not present

## 2022-10-21 DIAGNOSIS — E1165 Type 2 diabetes mellitus with hyperglycemia: Secondary | ICD-10-CM

## 2022-10-21 DIAGNOSIS — M79675 Pain in left toe(s): Secondary | ICD-10-CM

## 2022-10-21 NOTE — Progress Notes (Signed)
  Subjective:  Patient ID: Melissa Kennedy, female    DOB: September 18, 1945,  MRN: [id number]  Chief Complaint  Patient presents with   Nail Problem    Diabetic Foot Care- Patient administers oral medication and insulin for diabetes control.     77 y.o. female presents with the above complaint. History confirmed with patient. Patient presenting with pain related to dystrophic thickened elongated nails. Patient is unable to trim own nails related to nail dystrophy and/or mobility issues. Patient does  have a history of T2DM. Takes insulin.  Patient does have 2 calluses present that are causing pain with palpation  Objective:  Physical Exam: warm, good capillary refill nail exam onychomycosis of the toenails, onycholysis, and dystrophic nails DP pulses palpable, PT pulses palpable, and protective sensation diminished Left Foot:  Pain with palpation of nails due to elongation and dystrophic growth.  Callus present at the distal tip of the left hallux Right Foot: Pain with palpation of nails due to elongation and dystrophic growth.  Callus present to distal tip of the right hallux  Assessment:   1. Pain due to onychomycosis of toenails of both feet   2. Pre-ulcerative calluses   3. Type 2 diabetes mellitus with hyperglycemia, without long-term current use of insulin (HCC)       Plan:  Patient was evaluated and treated and all questions answered.  # Callus present distal aspect of the hallux bilaterally All symptomatic hyperkeratoses were safely debrided with a sterile #15 blade to patient's level of comfort without incident. We discussed preventative and palliative care of these lesions including supportive and accommodative shoegear, padding, prefabricated and custom molded accommodative orthoses, use of a pumice stone and lotions/creams daily.  #Onychomycosis with pain  -Nails palliatively debrided as below. -Educated on self-care  Procedure: Nail Debridement Rationale: Pain Type of  Debridement: manual, sharp debridement. Instrumentation: Nail nipper, rotary burr. Number of Nails: 10  Return in about 3 months (around 01/21/2023) for [facility].         Corinna Gab, DPM [facility]

## 2022-12-01 ENCOUNTER — Other Ambulatory Visit: Payer: Self-pay | Admitting: Internal Medicine

## 2023-01-02 DIAGNOSIS — R251 Tremor, unspecified: Secondary | ICD-10-CM | POA: Diagnosis not present

## 2023-01-02 DIAGNOSIS — M810 Age-related osteoporosis without current pathological fracture: Secondary | ICD-10-CM | POA: Diagnosis not present

## 2023-01-02 DIAGNOSIS — K219 Gastro-esophageal reflux disease without esophagitis: Secondary | ICD-10-CM | POA: Diagnosis not present

## 2023-01-02 DIAGNOSIS — I10 Essential (primary) hypertension: Secondary | ICD-10-CM | POA: Diagnosis not present

## 2023-01-02 DIAGNOSIS — I4721 Torsades de pointes: Secondary | ICD-10-CM | POA: Diagnosis not present

## 2023-01-02 DIAGNOSIS — F325 Major depressive disorder, single episode, in full remission: Secondary | ICD-10-CM | POA: Diagnosis not present

## 2023-01-02 DIAGNOSIS — I2510 Atherosclerotic heart disease of native coronary artery without angina pectoris: Secondary | ICD-10-CM | POA: Diagnosis not present

## 2023-01-02 DIAGNOSIS — E7800 Pure hypercholesterolemia, unspecified: Secondary | ICD-10-CM | POA: Diagnosis not present

## 2023-01-02 DIAGNOSIS — E1149 Type 2 diabetes mellitus with other diabetic neurological complication: Secondary | ICD-10-CM | POA: Diagnosis not present

## 2023-01-02 DIAGNOSIS — Z79899 Other long term (current) drug therapy: Secondary | ICD-10-CM | POA: Diagnosis not present

## 2023-01-09 ENCOUNTER — Ambulatory Visit: Payer: Medicare HMO | Admitting: Internal Medicine

## 2023-01-09 ENCOUNTER — Encounter: Payer: Self-pay | Admitting: Internal Medicine

## 2023-01-09 VITALS — BP 124/72 | HR 88 | Wt 183.8 lb

## 2023-01-09 DIAGNOSIS — M545 Low back pain, unspecified: Secondary | ICD-10-CM | POA: Diagnosis not present

## 2023-01-09 DIAGNOSIS — M8000XS Age-related osteoporosis with current pathological fracture, unspecified site, sequela: Secondary | ICD-10-CM

## 2023-01-09 DIAGNOSIS — E1165 Type 2 diabetes mellitus with hyperglycemia: Secondary | ICD-10-CM | POA: Diagnosis not present

## 2023-01-09 DIAGNOSIS — E2740 Unspecified adrenocortical insufficiency: Secondary | ICD-10-CM

## 2023-01-09 DIAGNOSIS — E559 Vitamin D deficiency, unspecified: Secondary | ICD-10-CM

## 2023-01-09 LAB — POCT GLYCOSYLATED HEMOGLOBIN (HGB A1C): Hemoglobin A1C: 8.7 % — AB (ref 4.0–5.6)

## 2023-01-09 MED ORDER — HYDROCORTISONE 10 MG PO TABS: ORAL_TABLET | ORAL | 3 refills | Status: DC

## 2023-01-09 MED ORDER — GLIPIZIDE 5 MG PO TABS: 5.0000 mg | ORAL_TABLET | Freq: Every day | ORAL | 3 refills | Status: DC

## 2023-01-09 NOTE — Progress Notes (Signed)
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 She has followed with [facility] since 08/08/2020 for consultative assistance with management of her adrenal insufficiency.   HISTORICAL SUMMARY:   She was noted to have orthostatic hypotension during hospitalization for left humeral fracture (03/2020) with BP as low as 70/50 mmhg. She was diagnosed with adrenal insufficiency with a serum cortisol of 1.4 ug/dL and started on dexamethasone  At the time.    In review of her records she had a serum glucose of 161 mg/dL, , Na 413 mmol/L and K 4.8 mmol/L with normal GFR during the ED visit  No prior history or glucocorticoid intake  On her initial visit to our clinic we switched dexamethasone to hydrocortisone.       OSTEOPOROSIS HISTORY: She has been diagnosed with Osteoporosis years ago. Was on Fosamax  But was cost prohibitive per pt . Has hx of humerus # in 03/2020   She has severe  heartburn and on PPI  On her initial visit she was found to have low magnesium and Vitamin D which were replenished. She declined PTH- rp analogues due to daily injections. Prolia was cost prohibitive  I ordered Zoledronic acid and was contacted by our office 01/2021 but she did not schedule   She finally received zoledronic acid 11/04/2021    DIABETES HISTORY: Pt has diabetes since ~ 2017. This has been controlled on Metformin only , with an A1c of 7.0 % per pt  A few months ago ( these records are not available. ) On her initial visit to our clinic she had an A1c of 10.4% . We increased Metformin and started basal insulin    But as noted severe  hyperglycemia since being on Dexamethasone    SUBJECTIVE:   Today (01/09/2023):  Melissa Kennedy is here for adrenal insufficiency, DM and osteoporosis.   She follows with cardiology for nonobstructive coronary artery disease and cardiomyopathy   She had a follow-up with podiatry 10/21/2022  She received zoledronic acid on 11/04/2021 without side effects Denies recent falls  No recent fracture  Denies diarrhea   Denies cramps/spasms  Has back pain       HOME ENDOCRINE MEDICATIONS: Hydrocortisone 10 mg, 1.5 tabs with Breakfast and half a tablet between 2-4 pm  Calcium 600 mg BID  Metformin 500 mg BID  Lantus 55 units daily Mag chloride 70 mg BID  Ergocalciferol 50,000 iu weekly     GLUCOSE LOG Unable to download  163 - 490mg /dL       HISTORY:  Past Medical History:  Past Medical History:  Diagnosis Date   AICD (automatic cardioverter/defibrillator) present    Coronary artery disease    Hypercholesteremia    Hypertension    Myocardial infarction (HCC)    Presence of permanent cardiac pacemaker    Past Surgical History:  Past Surgical History:  Procedure Laterality Date   ABDOMINAL HYSTERECTOMY     CHOLECYSTECTOMY     COLONOSCOPY WITH PROPOFOL N/A 10/14/2016   Procedure: COLONOSCOPY WITH PROPOFOL;  Surgeon: Christena Deem, MD;  Location: [facility] ENDOSCOPY;  Service: Endoscopy;  Laterality: N/A;   ESOPHAGOGASTRODUODENOSCOPY (EGD) WITH PROPOFOL N/A 10/14/2016   Procedure: ESOPHAGOGASTRODUODENOSCOPY (EGD) WITH PROPOFOL;  Surgeon: Christena Deem, MD;  Location: [facility] ENDOSCOPY;  Service: Endoscopy;  Laterality: N/A;   GOITER SURGERY     INSERTION PACEMAKER/DEFIB     KIDNEY STONE SURGERY     TUBAL LIGATION     Social History:  reports that she has never smoked. She has never used smokeless tobacco. She reports that she does not drink alcohol and does not use drugs. Family History:  Family History  Problem Relation Age of Onset   Breast cancer Neg Hx      HOME  MEDICATIONS: Allergies as of 01/09/2023       Reactions   Hydrocodone-acetaminophen Nausea Only   Sulfa Antibiotics Other (See Comments)   UNKNOWN        Medication List        Accurate as of January 09, 2023 10:12 AM. If you have any questions, ask your nurse or doctor.          amiodarone 200 MG tablet Commonly known as: PACERONE Take 0.5 tablets by mouth every other day.   aspirin EC 81 MG tablet Take 81 mg by mouth daily.   atorvastatin 20 MG tablet Commonly known as: LIPITOR Take 20 mg by mouth daily.   DULoxetine 30 MG capsule Commonly known as: CYMBALTA Take 30 mg by mouth daily.   furosemide 20 MG tablet Commonly known as: LASIX Take 40 mg by mouth daily.   hydrocortisone 10 MG tablet Commonly known as: CORTEF 1.5 tabs QAM and half a tablet in the afternoon   Insulin Pen Needle 32G X 4 MM Misc 1 Device by Does not apply route daily.   Lantus SoloStar 100 UNIT/ML Solostar Pen Generic drug: insulin glargine Inject 44 Units into the skin daily.   Magnesium Chloride 70 MG Tbec Take 1 tablet (70 mg total) by mouth 3 (three) times daily.   metFORMIN 500 MG 24 hr tablet Commonly known as: GLUCOPHAGE-XR Take 1 tablet (500 mg total) by mouth 2 (two) times daily.   metoprolol tartrate 25 MG tablet Commonly known as: LOPRESSOR Take 12.5 mg by mouth 2 (two) times daily.   RABEprazole 20 MG tablet Commonly known as: ACIPHEX Take 20 mg by mouth daily.   Slow Magnesium/Calcium 70-117 MG Tbec Generic drug: Magnesium Cl-Calcium Carbonate Take 1 tablet by mouth 3 (three) times daily.   spironolactone 25 MG tablet Commonly known as: ALDACTONE Take 25 mg by mouth daily.   Vitamin D (Ergocalciferol) 1.25 MG (50000 UNIT) Caps capsule Commonly known as: DRISDOL Take 1 capsule (50,000 Units total) by mouth every 7 (seven) days.          OBJECTIVE:   PHYSICAL EXAM: VS: BP 124/72 (BP Location: Right Arm, Patient Position: Sitting, Cuff Size: Small)    Pulse 88   Wt 183 lb 12.8 oz (83.4 kg)   SpO2 97%   BMI 29.67 kg/m    EXAM: General: Pt appears well and is in NAD  Lungs: Clear with good BS bilat with no rales, rhonchi, or wheezes  Heart: Auscultation: RRR.  Extremities:  BL LE: no pretibial edema   Mental Status: Judgment, insight: Intact Orientation: Oriented to time, place, and person Mood and affect: No depression, anxiety, or agitation   DM Foot Exam 10/21/2022 per  podiatry     DATA REVIEWED:  01/03/2023 BUN 29 Cr 1.49 GFR 36  TSH 1.810 Tg 198 LDL 78 HDL 41  A1c 8.5%    ASSESSMENT / PLAN / RECOMMENDATIONS:   Adrenal Insufficiency    - This was based on hypotension and low serum cortisol . She was on Dexamethasone for 5 months before switching her to [facility] in 07/2020 - Discussed sick day rule -Labs were reviewed through PCPs office that were done on 01/03/2023   Medications : Hydrocortisone 10 mg, 1.5 tabs with Breakfast and half a tablet between 2-4 pm        2. Osteoporosis      - Osteoporosis with fragility fracture  - Was on Fosamax in the past, would like to avoid due to GERD - I have recommended PTH analogs as the best option for osteoporosis and fragility fracture but she declines as daily injections would cause hardship on her. Prolia was cost prohibitive and were unable to follow through with the pt assistance.  -She was able to receive her first zoledronic acid infusion 11/04/2021, without any side effects  -Emphasized importance of optimizing calcium and vitamin D intake -An order for Reclast infusion has been placed in the system  Medications : - Continue Calcium 600 mg BID  - Ergocalciferol 50,000 weekly  - Continue Zoledronic Acid 5 mg IV annually    3.Type 2 Diabetes Mellitus, Poorly controlled- Most recent A1c of 8.7 %. Goal A1c < 7.0%.    - Intolerant to higher doses of Metformin  - SGLT-2  been cost prohibitive  - Not a candidate for GLP-1 agonist due to chronic diarrhea  -A1c remains  above goal -Will start glipizide with her only meal of the day which is suppertime, cautioned against hypoglycemia  Medications : Start glipizide 5 mg, 1 tablet before supper Continue Metformin 500 mg , 1 tab BID  Continue Lantus 55 units daily        4. Hypomagnsemia :     -Most likely this is caused by diuretic use and chronic diarrhea in the past  Medication  Continue mag chloride 70 mg two times  daily     5. Vitamin D deficiency :    -Vitamin D levels have been normal in the past   Continue ergocalciferol 50,000 iu weekly      Follow-up in 6 months   Signed electronically by: Lyndle Herrlich, MD  [facility]  [facility] [address] [place], [place] [address] Phone: [phone] FAX: [phone]      CC: Arlyss Queen [address] [place] [address] Phone: [phone]  Fax: [phone]   Return to Endocrinology clinic as below: Future Appointments  Date Time Provider Department Center  01/09/2023  1:40 PM Andalyn Heckstall, Konrad Dolores, MD [facility] None  01/27/2023  4:15 PM [place], Jenelle Mages, DPM [facility]

## 2023-01-09 NOTE — Patient Instructions (Addendum)
-   Start Glipizide 5 mg, 1 tablet before Supper  - Continue  Metformin 500 mg, 1 tablet with Breakfast and 1 tablet with Supper - Continue Lantus 55 units daily    -Continue hydrocortisone 10 mg, 1.5 tablets with breakfast and 1 tablet in the afternoon    -HOW TO TREAT LOW BLOOD SUGARS (Blood sugar LESS THAN 70 MG/DL) Please follow the RULE OF 15 for the treatment of hypoglycemia treatment (when your (blood sugars are less than 70 mg/dL)   STEP 1: Take 15 grams of carbohydrates when your blood sugar is low, which includes:  3-4 GLUCOSE TABS  OR 3-4 OZ OF JUICE OR REGULAR SODA OR ONE TUBE OF GLUCOSE GEL    STEP 2: RECHECK blood sugar in 15 MINUTES STEP 3: If your blood sugar is still low at the 15 minute recheck --> then, go back to STEP 1 and treat AGAIN with another 15 grams of carbohydrates.   ADRENAL INSUFFICIENCY SICK DAY RULES:  Should you face an extreme emotional or physical stress such as trauma, surgery or acute illness, this will require extra steroid coverage so that the body can meet that stress.   Without increasing the steroid dose you may experience severe weakness, headache, dizziness, nausea and vomiting and possibly a more serious deterioration in health.  Typically the dose of steroids will only need to be increased for a couple of days if you have an illness that is transient and managed in the community.   If you are unable to take/absorb an increased dose of steroids orally because of vomiting or diarrhea, you will urgently require steroid injections and should present to an Emergency Department.  The general advice for any serious illness is as follows: Double the normal daily steroid dose for up to 3 days if you have a temperature of more than 37.50C (99.78F) with signs of sickness, or severe emotional or physical distress Contact your primary care doctor and Endocrinologist if the illness worsens or it lasts for more than 3 days.  In cases of severe illness,  urgent medical assistance should be promptly sought. If you experience vomiting/diarrhea or are unable to take steroids by mouth, please administer the Hydrocortisone injection kit and seek urgent medical help.

## 2023-01-10 ENCOUNTER — Other Ambulatory Visit: Payer: Self-pay | Admitting: Internal Medicine

## 2023-01-12 ENCOUNTER — Encounter: Payer: Self-pay | Admitting: Internal Medicine

## 2023-01-20 ENCOUNTER — Telehealth: Payer: Self-pay | Admitting: Pharmacy Technician

## 2023-01-20 NOTE — Telephone Encounter (Signed)
Auth Submission: NO AUTH NEEDED Site of care: Site of care: [facility] Payer: [employer] MEDICARE Medication & CPT/J Code(s) submitted: Reclast (Zolendronic acid) [id number] Route of submission (phone, fax, portal):  Phone # Fax # Auth type: Buy/Bill PB Units/visits requested: X1 DOSE Reference number:  Approval from: 01/20/23 to 06/02/23

## 2023-01-22 DIAGNOSIS — Z79899 Other long term (current) drug therapy: Secondary | ICD-10-CM | POA: Diagnosis not present

## 2023-01-22 DIAGNOSIS — I469 Cardiac arrest, cause unspecified: Secondary | ICD-10-CM | POA: Diagnosis not present

## 2023-01-22 DIAGNOSIS — Z95810 Presence of automatic (implantable) cardiac defibrillator: Secondary | ICD-10-CM | POA: Diagnosis not present

## 2023-01-22 DIAGNOSIS — I2510 Atherosclerotic heart disease of native coronary artery without angina pectoris: Secondary | ICD-10-CM | POA: Diagnosis not present

## 2023-01-22 DIAGNOSIS — I255 Ischemic cardiomyopathy: Secondary | ICD-10-CM | POA: Diagnosis not present

## 2023-01-22 DIAGNOSIS — I429 Cardiomyopathy, unspecified: Secondary | ICD-10-CM | POA: Diagnosis not present

## 2023-01-22 DIAGNOSIS — Z1330 Encounter for screening examination for mental health and behavioral disorders, unspecified: Secondary | ICD-10-CM | POA: Diagnosis not present

## 2023-01-27 ENCOUNTER — Ambulatory Visit: Payer: Medicare HMO | Admitting: Podiatry

## 2023-01-27 DIAGNOSIS — E1165 Type 2 diabetes mellitus with hyperglycemia: Secondary | ICD-10-CM

## 2023-01-27 DIAGNOSIS — M79674 Pain in right toe(s): Secondary | ICD-10-CM | POA: Diagnosis not present

## 2023-01-27 DIAGNOSIS — L84 Corns and callosities: Secondary | ICD-10-CM

## 2023-01-27 DIAGNOSIS — M79675 Pain in left toe(s): Secondary | ICD-10-CM | POA: Diagnosis not present

## 2023-01-27 DIAGNOSIS — B351 Tinea unguium: Secondary | ICD-10-CM | POA: Diagnosis not present

## 2023-01-27 NOTE — Progress Notes (Signed)
  Subjective:  Patient ID: Melissa Kennedy, female    DOB: [birth year],  MRN: [id number]  Chief Complaint  Patient presents with   Nail Problem    Diabetic Foot Care-nail trim     77 y.o. female presents with the above complaint. History confirmed with patient. Patient presenting with pain related to dystrophic thickened elongated nails. Patient is unable to trim own nails related to nail dystrophy and/or mobility issues. Patient does  have a history of T2DM. Takes insulin.  Patient does have 2 calluses present that are causing pain with palpation  Objective:  Physical Exam: warm, good capillary refill nail exam onychomycosis of the toenails, onycholysis, and dystrophic nails DP pulses palpable, PT pulses palpable, and protective sensation diminished Left Foot:  Pain with palpation of nails due to elongation and dystrophic growth.  Callus present at the distal tip of the left hallux Right Foot: Pain with palpation of nails due to elongation and dystrophic growth.  Callus present to distal tip of the right hallux  Assessment:   1. Pain due to onychomycosis of toenails of both feet   2. Pre-ulcerative calluses   3. Type 2 diabetes mellitus with hyperglycemia, without long-term current use of insulin (HCC)     Plan:  Patient was evaluated and treated and all questions answered.  # Callus present distal aspect of the hallux bilaterally All symptomatic hyperkeratoses were safely debrided with a sterile #15 blade to patient's level of comfort without incident. We discussed preventative and palliative care of these lesions including supportive and accommodative shoegear, padding, prefabricated and custom molded accommodative orthoses, use of a pumice stone and lotions/creams daily.  #Onychomycosis with pain  -Nails palliatively debrided as below. -Educated on self-care  Procedure: Nail Debridement Rationale: Pain Type of Debridement: manual, sharp debridement. Instrumentation: Nail  nipper, rotary burr. Number of Nails: 10  Return in about 3 months (around 04/29/2023).         Corinna Gab, DPM [facility]

## 2023-02-05 DIAGNOSIS — N289 Disorder of kidney and ureter, unspecified: Secondary | ICD-10-CM | POA: Diagnosis not present

## 2023-02-13 ENCOUNTER — Ambulatory Visit (INDEPENDENT_AMBULATORY_CARE_PROVIDER_SITE_OTHER): Payer: Medicare HMO

## 2023-02-13 VITALS — BP 143/78 | HR 71 | Temp 98.1°F | Resp 16 | Ht 66.0 in | Wt 185.4 lb

## 2023-02-13 DIAGNOSIS — M810 Age-related osteoporosis without current pathological fracture: Secondary | ICD-10-CM | POA: Diagnosis not present

## 2023-02-13 DIAGNOSIS — M8000XS Age-related osteoporosis with current pathological fracture, unspecified site, sequela: Secondary | ICD-10-CM

## 2023-02-13 MED ORDER — DIPHENHYDRAMINE HCL 25 MG PO CAPS
25.0000 mg | ORAL_CAPSULE | Freq: Once | ORAL | Status: AC
  Administered 2023-02-13: 25 mg via ORAL
  Filled 2023-02-13: qty 1

## 2023-02-13 MED ORDER — ACETAMINOPHEN 325 MG PO TABS
650.0000 mg | ORAL_TABLET | Freq: Once | ORAL | Status: AC
  Administered 2023-02-13: 650 mg via ORAL
  Filled 2023-02-13: qty 2

## 2023-02-13 MED ORDER — ZOLEDRONIC ACID 5 MG/100ML IV SOLN
5.0000 mg | Freq: Once | INTRAVENOUS | Status: AC
  Administered 2023-02-13: 5 mg via INTRAVENOUS
  Filled 2023-02-13: qty 100

## 2023-02-13 NOTE — Addendum Note (Signed)
Addended by: Salli Real R on: 02/13/2023 04:28 PM   Modules accepted: Orders

## 2023-02-13 NOTE — Progress Notes (Signed)
Diagnosis: Osteoporosis  Provider:  Chilton Greathouse MD  Procedure: IV Infusion  IV Type: Peripheral, IV Location: R Antecubital  Reclast (Zolendronic Acid), Dose: 5 mg  Infusion Start Time: 1531  Infusion Stop Time: 1602  Post Infusion IV Care: Observation period completed and Peripheral IV Discontinued  Discharge: Condition: Good, Destination: Home . AVS Provided  Performed by:  Loney Hering, LPN

## 2023-02-18 DIAGNOSIS — R251 Tremor, unspecified: Secondary | ICD-10-CM | POA: Diagnosis not present

## 2023-02-18 DIAGNOSIS — R29898 Other symptoms and signs involving the musculoskeletal system: Secondary | ICD-10-CM | POA: Diagnosis not present

## 2023-02-18 DIAGNOSIS — R2689 Other abnormalities of gait and mobility: Secondary | ICD-10-CM | POA: Diagnosis not present

## 2023-03-01 DIAGNOSIS — R52 Pain, unspecified: Secondary | ICD-10-CM | POA: Diagnosis not present

## 2023-03-02 DIAGNOSIS — M25512 Pain in left shoulder: Secondary | ICD-10-CM | POA: Diagnosis not present

## 2023-03-02 DIAGNOSIS — Z885 Allergy status to narcotic agent status: Secondary | ICD-10-CM | POA: Diagnosis not present

## 2023-03-02 DIAGNOSIS — R0781 Pleurodynia: Secondary | ICD-10-CM | POA: Diagnosis not present

## 2023-03-02 DIAGNOSIS — I2510 Atherosclerotic heart disease of native coronary artery without angina pectoris: Secondary | ICD-10-CM | POA: Diagnosis not present

## 2023-03-02 DIAGNOSIS — S2242XA Multiple fractures of ribs, left side, initial encounter for closed fracture: Secondary | ICD-10-CM | POA: Diagnosis not present

## 2023-03-02 DIAGNOSIS — Z7982 Long term (current) use of aspirin: Secondary | ICD-10-CM | POA: Diagnosis not present

## 2023-03-02 DIAGNOSIS — E785 Hyperlipidemia, unspecified: Secondary | ICD-10-CM | POA: Diagnosis not present

## 2023-03-02 DIAGNOSIS — Z882 Allergy status to sulfonamides status: Secondary | ICD-10-CM | POA: Diagnosis not present

## 2023-03-02 DIAGNOSIS — Z95810 Presence of automatic (implantable) cardiac defibrillator: Secondary | ICD-10-CM | POA: Diagnosis not present

## 2023-03-02 DIAGNOSIS — R918 Other nonspecific abnormal finding of lung field: Secondary | ICD-10-CM | POA: Diagnosis not present

## 2023-03-02 DIAGNOSIS — I3481 Nonrheumatic mitral (valve) annulus calcification: Secondary | ICD-10-CM | POA: Diagnosis not present

## 2023-03-27 DIAGNOSIS — M6281 Muscle weakness (generalized): Secondary | ICD-10-CM | POA: Diagnosis not present

## 2023-03-27 DIAGNOSIS — R262 Difficulty in walking, not elsewhere classified: Secondary | ICD-10-CM | POA: Diagnosis not present

## 2023-03-27 DIAGNOSIS — Z9181 History of falling: Secondary | ICD-10-CM | POA: Diagnosis not present

## 2023-04-06 ENCOUNTER — Other Ambulatory Visit: Payer: Medicare HMO

## 2023-04-10 DIAGNOSIS — K219 Gastro-esophageal reflux disease without esophagitis: Secondary | ICD-10-CM | POA: Diagnosis not present

## 2023-04-10 DIAGNOSIS — I2510 Atherosclerotic heart disease of native coronary artery without angina pectoris: Secondary | ICD-10-CM | POA: Diagnosis not present

## 2023-04-10 DIAGNOSIS — Z23 Encounter for immunization: Secondary | ICD-10-CM | POA: Diagnosis not present

## 2023-04-10 DIAGNOSIS — I10 Essential (primary) hypertension: Secondary | ICD-10-CM | POA: Diagnosis not present

## 2023-04-10 DIAGNOSIS — Z79899 Other long term (current) drug therapy: Secondary | ICD-10-CM | POA: Diagnosis not present

## 2023-04-10 DIAGNOSIS — E7800 Pure hypercholesterolemia, unspecified: Secondary | ICD-10-CM | POA: Diagnosis not present

## 2023-04-10 DIAGNOSIS — M810 Age-related osteoporosis without current pathological fracture: Secondary | ICD-10-CM | POA: Diagnosis not present

## 2023-04-10 DIAGNOSIS — F325 Major depressive disorder, single episode, in full remission: Secondary | ICD-10-CM | POA: Diagnosis not present

## 2023-04-10 DIAGNOSIS — E1149 Type 2 diabetes mellitus with other diabetic neurological complication: Secondary | ICD-10-CM | POA: Diagnosis not present

## 2023-04-10 DIAGNOSIS — I4721 Torsades de pointes: Secondary | ICD-10-CM | POA: Diagnosis not present

## 2023-04-14 DIAGNOSIS — Z9181 History of falling: Secondary | ICD-10-CM | POA: Diagnosis not present

## 2023-04-14 DIAGNOSIS — M6281 Muscle weakness (generalized): Secondary | ICD-10-CM | POA: Diagnosis not present

## 2023-04-14 DIAGNOSIS — R262 Difficulty in walking, not elsewhere classified: Secondary | ICD-10-CM | POA: Diagnosis not present

## 2023-04-16 DIAGNOSIS — R262 Difficulty in walking, not elsewhere classified: Secondary | ICD-10-CM | POA: Diagnosis not present

## 2023-04-16 DIAGNOSIS — Z9181 History of falling: Secondary | ICD-10-CM | POA: Diagnosis not present

## 2023-04-16 DIAGNOSIS — M6281 Muscle weakness (generalized): Secondary | ICD-10-CM | POA: Diagnosis not present

## 2023-04-21 DIAGNOSIS — Z9181 History of falling: Secondary | ICD-10-CM | POA: Diagnosis not present

## 2023-04-21 DIAGNOSIS — R262 Difficulty in walking, not elsewhere classified: Secondary | ICD-10-CM | POA: Diagnosis not present

## 2023-04-21 DIAGNOSIS — M6281 Muscle weakness (generalized): Secondary | ICD-10-CM | POA: Diagnosis not present

## 2023-04-23 ENCOUNTER — Ambulatory Visit: Payer: Medicare HMO | Admitting: Podiatry

## 2023-04-23 DIAGNOSIS — R262 Difficulty in walking, not elsewhere classified: Secondary | ICD-10-CM | POA: Diagnosis not present

## 2023-04-23 DIAGNOSIS — Z9181 History of falling: Secondary | ICD-10-CM | POA: Diagnosis not present

## 2023-04-23 DIAGNOSIS — M6281 Muscle weakness (generalized): Secondary | ICD-10-CM | POA: Diagnosis not present

## 2023-04-27 DIAGNOSIS — M6281 Muscle weakness (generalized): Secondary | ICD-10-CM | POA: Diagnosis not present

## 2023-04-27 DIAGNOSIS — R262 Difficulty in walking, not elsewhere classified: Secondary | ICD-10-CM | POA: Diagnosis not present

## 2023-04-27 DIAGNOSIS — Z9181 History of falling: Secondary | ICD-10-CM | POA: Diagnosis not present

## 2023-04-29 DIAGNOSIS — R262 Difficulty in walking, not elsewhere classified: Secondary | ICD-10-CM | POA: Diagnosis not present

## 2023-04-29 DIAGNOSIS — Z9181 History of falling: Secondary | ICD-10-CM | POA: Diagnosis not present

## 2023-04-29 DIAGNOSIS — M6281 Muscle weakness (generalized): Secondary | ICD-10-CM | POA: Diagnosis not present

## 2023-05-05 DIAGNOSIS — Z9181 History of falling: Secondary | ICD-10-CM | POA: Diagnosis not present

## 2023-05-05 DIAGNOSIS — M6281 Muscle weakness (generalized): Secondary | ICD-10-CM | POA: Diagnosis not present

## 2023-05-05 DIAGNOSIS — R262 Difficulty in walking, not elsewhere classified: Secondary | ICD-10-CM | POA: Diagnosis not present

## 2023-05-07 DIAGNOSIS — M6281 Muscle weakness (generalized): Secondary | ICD-10-CM | POA: Diagnosis not present

## 2023-05-07 DIAGNOSIS — R262 Difficulty in walking, not elsewhere classified: Secondary | ICD-10-CM | POA: Diagnosis not present

## 2023-05-07 DIAGNOSIS — Z9181 History of falling: Secondary | ICD-10-CM | POA: Diagnosis not present

## 2023-05-12 DIAGNOSIS — M6281 Muscle weakness (generalized): Secondary | ICD-10-CM | POA: Diagnosis not present

## 2023-05-12 DIAGNOSIS — R262 Difficulty in walking, not elsewhere classified: Secondary | ICD-10-CM | POA: Diagnosis not present

## 2023-05-12 DIAGNOSIS — Z9181 History of falling: Secondary | ICD-10-CM | POA: Diagnosis not present

## 2023-05-14 DIAGNOSIS — R262 Difficulty in walking, not elsewhere classified: Secondary | ICD-10-CM | POA: Diagnosis not present

## 2023-05-14 DIAGNOSIS — Z9181 History of falling: Secondary | ICD-10-CM | POA: Diagnosis not present

## 2023-05-14 DIAGNOSIS — M6281 Muscle weakness (generalized): Secondary | ICD-10-CM | POA: Diagnosis not present

## 2023-05-15 DIAGNOSIS — R748 Abnormal levels of other serum enzymes: Secondary | ICD-10-CM | POA: Diagnosis not present

## 2023-05-15 DIAGNOSIS — N289 Disorder of kidney and ureter, unspecified: Secondary | ICD-10-CM | POA: Diagnosis not present

## 2023-05-19 DIAGNOSIS — R262 Difficulty in walking, not elsewhere classified: Secondary | ICD-10-CM | POA: Diagnosis not present

## 2023-05-19 DIAGNOSIS — Z9181 History of falling: Secondary | ICD-10-CM | POA: Diagnosis not present

## 2023-05-19 DIAGNOSIS — I429 Cardiomyopathy, unspecified: Secondary | ICD-10-CM | POA: Diagnosis not present

## 2023-05-19 DIAGNOSIS — M6281 Muscle weakness (generalized): Secondary | ICD-10-CM | POA: Diagnosis not present

## 2023-05-21 DIAGNOSIS — M6281 Muscle weakness (generalized): Secondary | ICD-10-CM | POA: Diagnosis not present

## 2023-05-21 DIAGNOSIS — R262 Difficulty in walking, not elsewhere classified: Secondary | ICD-10-CM | POA: Diagnosis not present

## 2023-05-21 DIAGNOSIS — Z9181 History of falling: Secondary | ICD-10-CM | POA: Diagnosis not present

## 2023-05-25 DIAGNOSIS — R262 Difficulty in walking, not elsewhere classified: Secondary | ICD-10-CM | POA: Diagnosis not present

## 2023-05-25 DIAGNOSIS — M6281 Muscle weakness (generalized): Secondary | ICD-10-CM | POA: Diagnosis not present

## 2023-05-25 DIAGNOSIS — Z9181 History of falling: Secondary | ICD-10-CM | POA: Diagnosis not present

## 2023-05-28 DIAGNOSIS — M6281 Muscle weakness (generalized): Secondary | ICD-10-CM | POA: Diagnosis not present

## 2023-05-28 DIAGNOSIS — Z9181 History of falling: Secondary | ICD-10-CM | POA: Diagnosis not present

## 2023-05-28 DIAGNOSIS — R262 Difficulty in walking, not elsewhere classified: Secondary | ICD-10-CM | POA: Diagnosis not present

## 2023-06-01 DIAGNOSIS — Z9181 History of falling: Secondary | ICD-10-CM | POA: Diagnosis not present

## 2023-06-01 DIAGNOSIS — M6281 Muscle weakness (generalized): Secondary | ICD-10-CM | POA: Diagnosis not present

## 2023-06-01 DIAGNOSIS — R262 Difficulty in walking, not elsewhere classified: Secondary | ICD-10-CM | POA: Diagnosis not present

## 2023-06-09 DIAGNOSIS — M6281 Muscle weakness (generalized): Secondary | ICD-10-CM | POA: Diagnosis not present

## 2023-06-09 DIAGNOSIS — R262 Difficulty in walking, not elsewhere classified: Secondary | ICD-10-CM | POA: Diagnosis not present

## 2023-06-09 DIAGNOSIS — Z9181 History of falling: Secondary | ICD-10-CM | POA: Diagnosis not present

## 2023-06-16 DIAGNOSIS — Z9181 History of falling: Secondary | ICD-10-CM | POA: Diagnosis not present

## 2023-06-16 DIAGNOSIS — R262 Difficulty in walking, not elsewhere classified: Secondary | ICD-10-CM | POA: Diagnosis not present

## 2023-06-16 DIAGNOSIS — M6281 Muscle weakness (generalized): Secondary | ICD-10-CM | POA: Diagnosis not present

## 2023-06-30 DIAGNOSIS — M6281 Muscle weakness (generalized): Secondary | ICD-10-CM | POA: Diagnosis not present

## 2023-06-30 DIAGNOSIS — R262 Difficulty in walking, not elsewhere classified: Secondary | ICD-10-CM | POA: Diagnosis not present

## 2023-06-30 DIAGNOSIS — Z9181 History of falling: Secondary | ICD-10-CM | POA: Diagnosis not present

## 2023-07-07 DIAGNOSIS — Z9181 History of falling: Secondary | ICD-10-CM | POA: Diagnosis not present

## 2023-07-07 DIAGNOSIS — M6281 Muscle weakness (generalized): Secondary | ICD-10-CM | POA: Diagnosis not present

## 2023-07-07 DIAGNOSIS — R262 Difficulty in walking, not elsewhere classified: Secondary | ICD-10-CM | POA: Diagnosis not present

## 2023-07-17 ENCOUNTER — Other Ambulatory Visit: Payer: Self-pay | Admitting: Internal Medicine

## 2023-07-17 ENCOUNTER — Encounter: Payer: Self-pay | Admitting: Internal Medicine

## 2023-07-17 ENCOUNTER — Ambulatory Visit: Payer: Medicare Other | Admitting: Internal Medicine

## 2023-07-17 VITALS — BP 122/78 | HR 97 | Ht 66.0 in | Wt 185.0 lb

## 2023-07-17 DIAGNOSIS — E2740 Unspecified adrenocortical insufficiency: Secondary | ICD-10-CM | POA: Diagnosis not present

## 2023-07-17 DIAGNOSIS — E1165 Type 2 diabetes mellitus with hyperglycemia: Secondary | ICD-10-CM | POA: Diagnosis not present

## 2023-07-17 DIAGNOSIS — M8000XS Age-related osteoporosis with current pathological fracture, unspecified site, sequela: Secondary | ICD-10-CM | POA: Diagnosis not present

## 2023-07-17 DIAGNOSIS — Z794 Long term (current) use of insulin: Secondary | ICD-10-CM

## 2023-07-17 DIAGNOSIS — E559 Vitamin D deficiency, unspecified: Secondary | ICD-10-CM | POA: Diagnosis not present

## 2023-07-17 DIAGNOSIS — Z7984 Long term (current) use of oral hypoglycemic drugs: Secondary | ICD-10-CM | POA: Diagnosis not present

## 2023-07-17 LAB — POCT GLYCOSYLATED HEMOGLOBIN (HGB A1C): Hemoglobin A1C: 7.5 % — AB (ref 4.0–5.6)

## 2023-07-17 MED ORDER — METFORMIN HCL ER 500 MG PO TB24: 500.0000 mg | ORAL_TABLET | Freq: Two times a day (BID) | ORAL | 0 refills | Status: DC

## 2023-07-17 MED ORDER — LANTUS SOLOSTAR 100 UNIT/ML ~~LOC~~ SOPN: 50.0000 [IU] | PEN_INJECTOR | Freq: Every day | SUBCUTANEOUS | 6 refills | Status: AC

## 2023-07-17 MED ORDER — REPAGLINIDE 0.5 MG PO TABS: 0.5000 mg | ORAL_TABLET | Freq: Two times a day (BID) | ORAL | 3 refills | Status: DC

## 2023-07-17 MED ORDER — HYDROCORTISONE 10 MG PO TABS: ORAL_TABLET | ORAL | 3 refills | Status: DC

## 2023-07-17 MED ORDER — VITAMIN D (ERGOCALCIFEROL) 1.25 MG (50000 UNIT) PO CAPS: 50000.0000 [IU] | ORAL_CAPSULE | ORAL | 3 refills | Status: AC

## 2023-07-17 MED ORDER — INSULIN PEN NEEDLE 32G X 4 MM MISC: 1.0000 | Freq: Every day | 3 refills | Status: AC

## 2023-07-17 NOTE — Patient Instructions (Addendum)
-   Start Repaglinide 0.5 mg, 1 tablet before Breakfast and 1 tablet before Supper - Continue  Metformin 500 mg, 1 tablet with Breakfast and 1 tablet with Supper - Decrease Lantus 50 units daily    -Continue hydrocortisone 10 mg, 1.5 tablets with breakfast and 1 tablet in the afternoon    -HOW TO TREAT LOW BLOOD SUGARS (Blood sugar LESS THAN 70 MG/DL) Please follow the RULE OF 15 for the treatment of hypoglycemia treatment (when your (blood sugars are less than 70 mg/dL)   STEP 1: Take 15 grams of carbohydrates when your blood sugar is low, which includes:  3-4 GLUCOSE TABS  OR 3-4 OZ OF JUICE OR REGULAR SODA OR ONE TUBE OF GLUCOSE GEL    STEP 2: RECHECK blood sugar in 15 MINUTES STEP 3: If your blood sugar is still low at the 15 minute recheck --> then, go back to STEP 1 and treat AGAIN with another 15 grams of carbohydrates.   ADRENAL INSUFFICIENCY SICK DAY RULES:  Should you face an extreme emotional or physical stress such as trauma, surgery or acute illness, this will require extra steroid coverage so that the body can meet that stress.   Without increasing the steroid dose you may experience severe weakness, headache, dizziness, nausea and vomiting and possibly a more serious deterioration in health.  Typically the dose of steroids will only need to be increased for a couple of days if you have an illness that is transient and managed in the community.   If you are unable to take/absorb an increased dose of steroids orally because of vomiting or diarrhea, you will urgently require steroid injections and should present to an Emergency Department.  The general advice for any serious illness is as follows: Double the normal daily steroid dose for up to 3 days if you have a temperature of more than 37.50C (99.65F) with signs of sickness, or severe emotional or physical distress Contact your primary care doctor and Endocrinologist if the illness worsens or it lasts for more than 3  days.  In cases of severe illness, urgent medical assistance should be promptly sought. If you experience vomiting/diarrhea or are unable to take steroids by mouth, please administer the Hydrocortisone injection kit and seek urgent medical help.

## 2023-07-17 NOTE — Progress Notes (Signed)
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 She has followed with [facility] since 08/08/2020 for consultative assistance with management of her adrenal insufficiency.   HISTORICAL SUMMARY:   She was noted to have orthostatic hypotension during hospitalization for left humeral fracture (03/2020) with BP as low as 70/50 mmhg. She was diagnosed with adrenal insufficiency with a serum cortisol of 1.4 ug/dL and started on dexamethasone  At the time.    In review of her records she had a serum glucose of 161 mg/dL, , Na 400 mmol/L and K 4.8 mmol/L with normal GFR during the ED visit  No prior history or glucocorticoid intake  On her initial visit to our clinic we switched dexamethasone to hydrocortisone.       OSTEOPOROSIS HISTORY: She has been diagnosed with Osteoporosis years ago. Was on Fosamax  But was cost prohibitive per pt . Has hx of humerus # in 03/2020   She has severe  heartburn and on PPI  On her initial visit she was found to have low magnesium and Vitamin D which were replenished. She declined PTH- rp analogues due to daily injections. Prolia was cost prohibitive  I ordered Zoledronic acid and was contacted by our office 01/2021 but she did not schedule   She finally received zoledronic acid 11/04/2021, and 02/13/2023    DIABETES HISTORY: Pt has diabetes since ~ 2017. This has been controlled on Metformin only , with an A1c of 7.0 % per pt  A few months ago ( these records are not available. ) On her initial visit to our clinic she had an A1c of 10.4% . We increased Metformin and started basal insulin       I  attempted to prescribe glipizide 01/2023, but the patient stated the pharmacist was not comfortable dispensing the medications due to her allergy to sulfa in the past   Repaglinide was started 07/2023  SUBJECTIVE:   Today (07/17/2023):  Melissa Kennedy is here for adrenal insufficiency, DM and osteoporosis.   She follows with cardiology for nonobstructive coronary artery disease and cardiomyopathy She has been following up with physical therapy for gait disturbance  She presented to the ED 02/2023 following a fall, she was noted with left rib fractures  She was seen by podiatry 01/27/2023  Denies constipation or constipation  Denies nausea or vomiting     HOME ENDOCRINE MEDICATIONS: Hydrocortisone 10 mg, 1.5 tabs with Breakfast and half a tablet between 2-4 pm  Calcium 600 mg BID  Metformin 500 mg BID  Lantus 55 units daily Mag chloride 70 mg BID  Ergocalciferol 50,000 iu weekly     GLUCOSE LOG Unable to download   96-326 mg/dL    HISTORY:  Past Medical History:  Past Medical History:  Diagnosis Date   AICD (automatic cardioverter/defibrillator) present    Coronary artery disease    Hypercholesteremia    Hypertension    Myocardial infarction (HCC)    Presence of permanent cardiac pacemaker    Past Surgical History:  Past Surgical History:  Procedure Laterality Date   ABDOMINAL HYSTERECTOMY  CHOLECYSTECTOMY     COLONOSCOPY WITH PROPOFOL N/A 10/14/2016   Procedure: COLONOSCOPY WITH PROPOFOL;  Surgeon: Christena Deem, MD;  Location: [facility] ENDOSCOPY;  Service: Endoscopy;  Laterality: N/A;   ESOPHAGOGASTRODUODENOSCOPY (EGD) WITH PROPOFOL N/A 10/14/2016   Procedure: ESOPHAGOGASTRODUODENOSCOPY (EGD) WITH PROPOFOL;  Surgeon: Christena Deem, MD;  Location: [facility] ENDOSCOPY;  Service: Endoscopy;  Laterality: N/A;   GOITER SURGERY     INSERTION PACEMAKER/DEFIB     KIDNEY STONE SURGERY     TUBAL LIGATION     Social History:  reports that she has never smoked. She has  never used smokeless tobacco. She reports that she does not drink alcohol and does not use drugs. Family History:  Family History  Problem Relation Age of Onset   Breast cancer Neg Hx      HOME MEDICATIONS: Allergies as of 07/17/2023       Reactions   Hydrocodone-acetaminophen Nausea Only   Sulfa Antibiotics Other (See Comments)   UNKNOWN        Medication List        Accurate as of July 17, 2023  1:43 PM. If you have any questions, ask your nurse or doctor.          amiodarone 200 MG tablet Commonly known as: PACERONE Take 0.5 tablets by mouth every other day.   aspirin EC 81 MG tablet Take 81 mg by mouth daily.   atorvastatin 20 MG tablet Commonly known as: LIPITOR Take 20 mg by mouth daily.   Calcium High Potency/Vitamin D 600-5 MG-MCG Tabs Generic drug: Calcium Carb-Cholecalciferol Take by mouth.   Culturelle Caps Take 1 capsule by mouth daily.   DULoxetine 30 MG capsule Commonly known as: CYMBALTA Take 30 mg by mouth daily.   furosemide 20 MG tablet Commonly known as: LASIX Take 40 mg by mouth daily.   glipiZIDE 5 MG tablet Commonly known as: GLUCOTROL TAKE 1 TABLET (5 MG TOTAL) BY MOUTH DAILY BEFORE SUPPER.   hydrocortisone 10 MG tablet Commonly known as: CORTEF 1.5 tabs QAM and half a tablet in the afternoon   Insulin Pen Needle 32G X 4 MM Misc 1 Device by Does not apply route daily.   Lantus SoloStar 100 UNIT/ML Solostar Pen Generic drug: insulin glargine Inject 44 Units into the skin daily. What changed: how much to take   Magnesium Chloride 70 MG Tbec Take 1 tablet (70 mg total) by mouth 3 (three) times daily.   meloxicam 7.5 MG tablet Commonly known as: MOBIC Take 7.5 mg by mouth daily.   metFORMIN 500 MG 24 hr tablet Commonly known as: GLUCOPHAGE-XR TAKE 1 TABLET BY MOUTH TWICE A DAY   metoprolol tartrate 25 MG tablet Commonly known as: LOPRESSOR Take 12.5 mg by mouth 2 (two) times daily.   RABEprazole 20 MG  tablet Commonly known as: ACIPHEX Take 20 mg by mouth daily.   Slow Magnesium/Calcium 70-117 MG Tbec Generic drug: Magnesium Cl-Calcium Carbonate Take 1 tablet by mouth 3 (three) times daily.   spironolactone 25 MG tablet Commonly known as: ALDACTONE Take 25 mg by mouth daily.   Vitamin D (Ergocalciferol) 1.25 MG (50000 UNIT) Caps capsule Commonly known as: DRISDOL Take 1 capsule (50,000 Units total) by mouth every 7 (seven) days.          OBJECTIVE:   PHYSICAL EXAM: VS: BP 122/78 (BP Location: Right Arm, Patient Position: Sitting, Cuff Size: Normal)   Pulse 97   Ht 5\' 6"  (1.676 m)   Wt 185 lb (83.9  kg)   SpO2 98%   BMI 29.86 kg/m    EXAM: General: Pt appears well and is in NAD  Lungs: Clear with good BS bilat   Heart: Auscultation: RRR.  Extremities:  BL LE: no pretibial edema   Mental Status: Judgment, insight: Intact Orientation: Oriented to time, place, and person Mood and affect: No depression, anxiety, or agitation   DM Foot Exam 04/26/2023 per  podiatry     DATA REVIEWED: 05/15/2023 BUN 16 Cr 1.140 GFR 50 TSH 2.140   01/02/2023 Tg 198 LDL 78 HDL 41    ASSESSMENT / PLAN / RECOMMENDATIONS:   Adrenal Insufficiency    - This was based on hypotension and low serum cortisol . She was on Dexamethasone for 5 months before switching her to [facility] in 07/2020 - Discussed sick day rule -Labs were reviewed through PCPs office that were done on 05/2023   Medications : Hydrocortisone 10 mg, 1.5 tabs with Breakfast and half a tablet between 2-4 pm        2. Osteoporosis      - Osteoporosis with fragility fracture  - Was on Fosamax in the past, would like to avoid due to GERD - I have recommended PTH analogs as the best option for osteoporosis and fragility fracture but she declines as daily injections would cause hardship on her. - Prolia was cost prohibitive and were unable to follow through with the pt assistance.  -She was able to receive her  first zoledronic acid infusion 11/04/2021, without any side effects , received her second dose 02/2023 -Emphasized importance of optimizing calcium and vitamin D intake -She is past due for a bone density scan, she has an outstanding order for this, but has been busy with spouses health over the past few months.  Patient advised to contact [facility] and schedule bone density appointment  Medications : - Continue Calcium 600 mg BID  - Ergocalciferol 50,000 weekly  - Continue Zoledronic Acid 5 mg IV annually    3.Type 2 Diabetes Mellitus, Sub-optimally controlled- Most recent A1c of 7.5 %. Goal A1c < 7.0%.    -A1c trending down - Intolerant to higher doses of Metformin  - SGLT-2  been cost prohibitive  - Not a candidate for GLP-1 agonist due to chronic diarrhea  -I tried to prescribe glipizide to target postprandial hyperglycemia, per patient the pharmacist was not comfortable dispensing glipizide due to sulfa allergy. -Will start repaglinide as below, caution against risk of hypoglycemia and to contact our office -Will decrease Lantus preemptively to prevent hypoglycemia   Medications : Start repaglinide 0.5 mg, 1 tablet before breakfast and supper Continue Metformin 500 mg , 1 tab BID  Decrease Lantus 50 units daily        4. Hypomagnsemia :     -Most likely this is caused by diuretic use and chronic diarrhea in the past  Medication  Continue mag chloride 70 mg two times  daily     5. Vitamin D deficiency :    -Vitamin D levels have been normal in the past   Continue ergocalciferol 50,000 iu weekly      Follow-up in 6 months   Signed electronically by: Lyndle Herrlich, MD  [facility]  [facility] [address] [place], [place] [address] Phone: [phone] FAX: [phone]      CC: Arlyss Queen [address] [place] [address] Phone: [phone]  Fax: [phone]   Return to  Endocrinology clinic  as below: No future appointments.

## 2023-07-24 DIAGNOSIS — I2510 Atherosclerotic heart disease of native coronary artery without angina pectoris: Secondary | ICD-10-CM | POA: Diagnosis not present

## 2023-07-24 DIAGNOSIS — E1149 Type 2 diabetes mellitus with other diabetic neurological complication: Secondary | ICD-10-CM | POA: Diagnosis not present

## 2023-07-24 DIAGNOSIS — Z9181 History of falling: Secondary | ICD-10-CM | POA: Diagnosis not present

## 2023-07-24 DIAGNOSIS — R251 Tremor, unspecified: Secondary | ICD-10-CM | POA: Diagnosis not present

## 2023-07-24 DIAGNOSIS — Z79899 Other long term (current) drug therapy: Secondary | ICD-10-CM | POA: Diagnosis not present

## 2023-07-24 DIAGNOSIS — I4721 Torsades de pointes: Secondary | ICD-10-CM | POA: Diagnosis not present

## 2023-07-24 DIAGNOSIS — Z139 Encounter for screening, unspecified: Secondary | ICD-10-CM | POA: Diagnosis not present

## 2023-07-24 DIAGNOSIS — I10 Essential (primary) hypertension: Secondary | ICD-10-CM | POA: Diagnosis not present

## 2023-07-24 DIAGNOSIS — K219 Gastro-esophageal reflux disease without esophagitis: Secondary | ICD-10-CM | POA: Diagnosis not present

## 2023-07-24 DIAGNOSIS — E7800 Pure hypercholesterolemia, unspecified: Secondary | ICD-10-CM | POA: Diagnosis not present

## 2023-07-24 DIAGNOSIS — M810 Age-related osteoporosis without current pathological fracture: Secondary | ICD-10-CM | POA: Diagnosis not present

## 2023-07-25 LAB — LAB REPORT - SCANNED
EGFR: 43
TSH: 1.87 (ref 0.41–5.90)

## 2023-09-01 ENCOUNTER — Encounter: Payer: Self-pay | Admitting: Podiatry

## 2023-09-01 ENCOUNTER — Ambulatory Visit (INDEPENDENT_AMBULATORY_CARE_PROVIDER_SITE_OTHER): Admitting: Podiatry

## 2023-09-01 DIAGNOSIS — L84 Corns and callosities: Secondary | ICD-10-CM

## 2023-09-01 DIAGNOSIS — B351 Tinea unguium: Secondary | ICD-10-CM | POA: Diagnosis not present

## 2023-09-01 DIAGNOSIS — E1142 Type 2 diabetes mellitus with diabetic polyneuropathy: Secondary | ICD-10-CM

## 2023-09-01 DIAGNOSIS — M79675 Pain in left toe(s): Secondary | ICD-10-CM | POA: Diagnosis not present

## 2023-09-01 DIAGNOSIS — M79674 Pain in right toe(s): Secondary | ICD-10-CM

## 2023-09-01 NOTE — Progress Notes (Signed)
  Subjective:  Patient ID: Melissa Kennedy, female    DOB: [birth year],  MRN: [id number]  Chief Complaint  Patient presents with   [facility]    [facility] with callous on both great toes, medial side.  Her last A1c was within the last 3 months she thinks it was 8.  Takes ASA 58    78 y.o. female presents with the above complaint. History confirmed with patient. Patient presenting with pain related to dystrophic thickened elongated nails. Patient is unable to trim own nails related to nail dystrophy and/or mobility issues. Patient does  have a history of T2DM. Takes insulin.  Patient does have 2 calluses present that are causing pain with palpation to the medial aspect of the big toes.  Objective:  Physical Exam: warm, good capillary refill, pedal skin atrophic, diminished pedal hair growth nail exam onychomycosis of the toenails, onycholysis, and dystrophic nails DP pulses palpable, PT pulses faintly palpable, and protective sensation diminished Left Foot:  Pain with palpation of nails due to elongation and dystrophic growth.  Callus present pain plantar medial aspect of head of proximal phalanx Right Foot: Pain with palpation of nails due to elongation and dystrophic growth.  Callus present pain plantar medial aspect of head of proximal phalanx  Assessment:   1. Pain due to onychomycosis of toenails of both feet   2. Pre-ulcerative calluses   3. Type 2 diabetes mellitus with peripheral neuropathy (HCC)     Plan:  Patient was evaluated and treated and all questions answered.  # Callus present plantar medial aspect of first proximal phalanx bilaterally All symptomatic hyperkeratoses x 2 were safely debrided with a sterile #15 blade to patient's level of comfort without incident. We discussed preventative and palliative care of these lesions including supportive and accommodative shoegear, padding, prefabricated and custom molded accommodative orthoses, use of a pumice stone and lotions/creams  daily.  #Onychomycosis with pain  -Nails palliatively debrided as below. -Educated on self-care  Procedure: Nail Debridement Rationale: Pain Type of Debridement: manual, sharp debridement. Instrumentation: Nail nipper, rotary burr. Number of Nails: 10  Patient educated on diabetes. Discussed proper diabetic foot care and discussed risks and complications of disease. Educated patient in depth on reasons to return to the office immediately should he/she discover anything concerning or new on the feet. All questions answered. Discussed proper shoes as well.    Return in about 3 months (around 12/01/2023) for Diabetic Foot Care.         Prashant Glosser L. Jamse Arn, DPM [facility] / [facility]

## 2023-09-14 DIAGNOSIS — Z95810 Presence of automatic (implantable) cardiac defibrillator: Secondary | ICD-10-CM | POA: Diagnosis not present

## 2023-09-14 DIAGNOSIS — I519 Heart disease, unspecified: Secondary | ICD-10-CM | POA: Diagnosis not present

## 2023-12-01 ENCOUNTER — Ambulatory Visit: Admitting: Podiatry

## 2023-12-15 DIAGNOSIS — Z95810 Presence of automatic (implantable) cardiac defibrillator: Secondary | ICD-10-CM | POA: Diagnosis not present

## 2023-12-15 DIAGNOSIS — I429 Cardiomyopathy, unspecified: Secondary | ICD-10-CM | POA: Diagnosis not present

## 2024-01-13 ENCOUNTER — Other Ambulatory Visit: Payer: Self-pay | Admitting: Internal Medicine

## 2024-01-15 ENCOUNTER — Ambulatory Visit: Payer: Medicare Other | Admitting: Internal Medicine

## 2024-01-26 DIAGNOSIS — Z1330 Encounter for screening examination for mental health and behavioral disorders, unspecified: Secondary | ICD-10-CM | POA: Diagnosis not present

## 2024-01-26 DIAGNOSIS — Z95810 Presence of automatic (implantable) cardiac defibrillator: Secondary | ICD-10-CM | POA: Diagnosis not present

## 2024-01-26 DIAGNOSIS — I429 Cardiomyopathy, unspecified: Secondary | ICD-10-CM | POA: Diagnosis not present

## 2024-01-26 DIAGNOSIS — I469 Cardiac arrest, cause unspecified: Secondary | ICD-10-CM | POA: Diagnosis not present

## 2024-01-26 DIAGNOSIS — Z79899 Other long term (current) drug therapy: Secondary | ICD-10-CM | POA: Diagnosis not present

## 2024-01-26 DIAGNOSIS — I2510 Atherosclerotic heart disease of native coronary artery without angina pectoris: Secondary | ICD-10-CM | POA: Diagnosis not present

## 2024-01-26 NOTE — Progress Notes (Signed)
 Return Office Visit  01/26/2024  Visit Problem List:  1. Cardiomyopathy, unspecified type (*)  Hepatic Function Panel   TSH    2. ICD (implantable cardioverter-defibrillator) in place  Hepatic Function Panel   TSH    3. On amiodarone therapy  Hepatic Function Panel   TSH    4. Nonobstructive atherosclerosis of coronary artery  Hepatic Function Panel   TSH    5. Cardiac arrest (*)  Hepatic Function Panel   TSH       Chronic Problem List:  Patient Active Problem List  Diagnosis  . Closed fracture of left proximal humerus-03/19/2020  . 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  acetaminophen  (TYLENOL ) 325 mg tablet, Take two tablets (650 mg dose) by mouth every 6 (six) hours. Take over the counter Tylenol ., Disp: , Rfl:  .  amiodarone (PACERONE) 200 MG tablet, Take one half tablet (100 mg dose) by mouth every other day., Disp: , Rfl:  .  aspirin (ASPRI-LOW,ECOTRIN LOW DOSE) 81 mg EC tablet, Take one tablet (81 mg dose) by mouth daily., Disp: , Rfl:  .  atorvastatin (LIPITOR) 20 mg tablet, Take one tablet (20 mg dose) by mouth daily., Disp: , Rfl:  .  Calcium  Carbonate (CALCIUM  600 PO), Take by mouth., Disp: , Rfl:  .  DULoxetine HCl (CYMBALTA) 30 mg capsule, Take one capsule (30 mg dose) by mouth daily., Disp: , Rfl:  .  ergocalciferol  (ERGOCALCIFEROL ) 1.25 MG (50000 UT) capsule, Take by mouth once a week., Disp: , Rfl:  .  furosemide (LASIX) 40 mg tablet, TAKE ONE TABLET (40 MG DOSE) BY MOUTH IN THE MORNING., Disp: 90 tablet, Rfl: 6 .  hydrocortisone  (CORTEF ) 10 mg tablet, Take 1 and 1/2 (half) tablet with Breakfast and take 1/2 (half) a tablet between 2-4 pm, Disp: , Rfl:  .  insulin  glargine (LANTUS  SOLOSTAR) 100 units/mL pen, Inject sixteen Units into the skin daily., Disp: , Rfl:  .  magnesium  chloride-calcium   carbonate (SLOW-MAG) 71.5-119 mg TBEC per tablet, Take one tablet by mouth daily., Disp: , Rfl:  .  meloxicam (MOBIC) 15 mg tablet, Take one tablet (15 mg dose) by mouth as needed for Pain., Disp: , Rfl:  .  metFORMIN  ER (GLUCOPHAGE -XR) 500 mg 24 hr tablet, Take one tablet (500 mg dose) by mouth 2 (two) times daily. 2 tablets twice a day, Disp: , Rfl:  .  metoprolol tartrate (LOPRESSOR) 25 mg tablet, Take one half tablet (12.5 mg dose) by mouth 2 (two) times daily., Disp: , Rfl:  .  RABEprazole (ACIPHEX) 20 MG tablet, Take one tablet (20 mg dose) by mouth daily., Disp: , Rfl:  .  spironolactone (ALDACTONE) 25 mg tablet, TAKE 1 TABLET BY MOUTH EVERY DAY, Disp: 90 tablet, Rfl: 6  Interval History: Melissa Kennedy is here for her yearly visit regarding her history of indwelling ICD, ventricular tachycardia on amiodarone suppression and nonobstructive coronary artery disease.  Patient denies chest pain, shortness of breath or palpitations.  No orthopnea, pedal edema or PND. Last surveillance labs were appropriate but recommend repeat so will check again. Chest xray 07/2022 was unremarkable but will get this rechecked for annual surveillance. In-person interrogation of the device today demonstrates good pacing and sensing function and no episodes. Remaining battery life is 3 years. Will plan to update echocardiogram this year as well.  ROS General - weight stable, appetite normal; no fever HEENT- vision stable, hearing  normal; no difficulty swallowing Endocrine - thyroid  stable Resp - denies cough, hemoptysis, purulent sputum Abdomen - denies belly pain, swelling; denies liver disease or jaundice GI - no hematemesis, melena. Normal bowel movements. No Crohns/UC history GU - no dysuria, hematuria, pyuria. No UTI or kidney stone. Neuro - no slurred speech, blurred vision; denies amaurosis or sided weakness Skin - no rash, bruising, petechia Hematologic - no recent bleeding or easy bruising Allergy  - denies food allergy; drug allergies as noted Psych - No depression, normal sleep  Physical Examination Vitals:   01/26/24 1012  BP: 118/78  BP Location: Right Upper Arm  Patient Position: Sitting  Pulse: 93  SpO2: 95%  Weight: 183 lb 9.6 oz (83.3 kg)  Height: 5' 5 (1.651 m)    General - Pt. in NAD, normal demeanor, speech normal, oriented normally HEENT - PERRLA, EOMs intact, nares and throat clear Neck - carotid upstrokes normal, bruits not present. Thyroid  nonpalpable. No palpable nodes. Lungs - auscultation reveals clear breath sounds Heart - regular rhythm, normal S1, S2, no gallop, no murmur is present Abdomen - soft, bowel sounds positive, no organomegaly Extrem - no edema is noted, no cyanosis, clubbing is seen Skin - no rash, bruising or petechia Back - no CVA tenderness Neuro - strength is symmetric in upper and lower extremities; reflexes normal, speech normal, face symmetric Psych - affect appropriate  Post-visit Impression:  Nonobstructive coronary artery disease Indwelling defibrillator History of cardiac arrest NSVT Dyspnea/lower extremity edema Ischemic cardiomyopathy  Recommendations:  Repeat TSH, liver panel, and Chest xray Echocardiogram Continue amiodarone 100 mg twice weekly Monday/Thursday Continue device clinic per routine Continue other medications as usual Patient was advised to visit ED or contact this clinic should any significant or worsening cardiac symptoms occur before next visit.    Follow up visit:  annually or sooner as needed   Josette Silvan, PA-C [facility]

## 2024-02-02 ENCOUNTER — Ambulatory Visit: Admitting: Internal Medicine

## 2024-02-02 ENCOUNTER — Telehealth: Payer: Self-pay

## 2024-02-02 VITALS — BP 120/70 | HR 56 | Ht 66.0 in | Wt 184.0 lb

## 2024-02-02 DIAGNOSIS — Z7984 Long term (current) use of oral hypoglycemic drugs: Secondary | ICD-10-CM | POA: Diagnosis not present

## 2024-02-02 DIAGNOSIS — E2740 Unspecified adrenocortical insufficiency: Secondary | ICD-10-CM | POA: Diagnosis not present

## 2024-02-02 DIAGNOSIS — E559 Vitamin D deficiency, unspecified: Secondary | ICD-10-CM | POA: Diagnosis not present

## 2024-02-02 DIAGNOSIS — M8000XS Age-related osteoporosis with current pathological fracture, unspecified site, sequela: Secondary | ICD-10-CM

## 2024-02-02 DIAGNOSIS — E1165 Type 2 diabetes mellitus with hyperglycemia: Secondary | ICD-10-CM

## 2024-02-02 DIAGNOSIS — Z794 Long term (current) use of insulin: Secondary | ICD-10-CM

## 2024-02-02 LAB — POCT GLYCOSYLATED HEMOGLOBIN (HGB A1C): Hemoglobin A1C: 7.2 % — AB (ref 4.0–5.6)

## 2024-02-02 LAB — POCT GLUCOSE (DEVICE FOR HOME USE): POC Glucose: 128 mg/dL — AB (ref 70–99)

## 2024-02-02 MED ORDER — HYDROCORTISONE 10 MG PO TABS: ORAL_TABLET | ORAL | 3 refills | Status: AC

## 2024-02-02 MED ORDER — REPAGLINIDE 0.5 MG PO TABS: 0.5000 mg | ORAL_TABLET | Freq: Two times a day (BID) | ORAL | 3 refills | Status: AC

## 2024-02-02 MED ORDER — DEXCOM G7 SENSOR MISC: 1.0000 | 3 refills | Status: AC

## 2024-02-02 MED ORDER — DEXCOM G7 RECEIVER DEVI: 1.0000 | Freq: Every day | 0 refills | Status: AC

## 2024-02-02 NOTE — Progress Notes (Unsigned)
 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 She has followed with [facility] since 08/08/2020 for consultative assistance with management of her adrenal insufficiency.   HISTORICAL SUMMARY:   She was noted to have orthostatic hypotension during hospitalization for left humeral fracture (03/2020) with BP as low as 70/50 mmhg. She was diagnosed with adrenal insufficiency with a serum cortisol of 1.4 ug/dL and started on dexamethasone  At the time.    In review of her records she had a serum glucose of 161 mg/dL, , Na 857 mmol/L and K 4.8 mmol/L with normal GFR during the ED visit  No prior history or glucocorticoid intake  On her initial visit to our clinic we switched dexamethasone to hydrocortisone .       OSTEOPOROSIS HISTORY: She has been diagnosed with Osteoporosis years ago. Was on Fosamax  But was cost prohibitive per pt . Has hx of humerus # in 03/2020   She has severe  heartburn and on PPI  On her initial visit she was found to have low magnesium  and Vitamin D  which were replenished. She declined PTH- rp analogues due to daily injections. Prolia  was cost prohibitive  I ordered Zoledronic  acid and was contacted by our office 01/2021 but she did not schedule   She finally received zoledronic  acid 11/04/2021, and 02/13/2023    DIABETES HISTORY: Pt has diabetes since ~ 2017. This has been controlled on Metformin  only , with an A1c of 7.0 % per pt  A few months ago ( these records are not available. ) On her initial visit to our clinic she had an A1c of 10.4% . We increased Metformin  and started basal insulin      I  attempted to prescribe glipizide  01/2023, but the patient stated the pharmacist was not comfortable dispensing the medications due to her allergy to sulfa in the past  Repaglinide  was prescribed  in 07/2023, upon her return to our office in September, 2025 she has not restarted it yet as she could not recall what was the medicine for  SUBJECTIVE:   Today (02/02/2024):  Melissa Kennedy is here for adrenal insufficiency, DM and osteoporosis.  She checks glucose twice daily, no hypoglycemia   She follows with cardiology for nonobstructive coronary artery disease, and cardiomyopathy She was seen by podiatry 09/01/2023  Denies constipation or diarrhea  Denies nausea or vomiting  No recent falls  She is complaining of right-sided back/flank pain, she has history of renal stones but none recently.  She was evaluated by her PCP, un known outcome at this time   HOME ENDOCRINE MEDICATIONS: Hydrocortisone  10 mg, 1.5 tabs with Breakfast and half a tablet between 2-4 pm  Calcium  600 mg BID  Metformin  500 mg BID  Repaglinide  0.5 mg 1 tablet before breakfast and supper- not taking  Lantus  50 units daily Mag chloride 70 mg BID  Ergocalciferol  50,000 iu weekly     GLUCOSE LOG    HISTORY:  Past Medical History:  Past Medical History:  Diagnosis Date   AICD (automatic cardioverter/defibrillator) present    Coronary artery disease    Hypercholesteremia  Hypertension    Myocardial infarction [facility])    Presence of permanent cardiac pacemaker    Past Surgical History:  Past Surgical History:  Procedure Laterality Date   ABDOMINAL HYSTERECTOMY     CHOLECYSTECTOMY     COLONOSCOPY WITH PROPOFOL  N/A 10/14/2016   Procedure: COLONOSCOPY WITH PROPOFOL ;  Surgeon: Gaylyn Gladis PENNER, MD;  Location: [facility] ENDOSCOPY;  Service: Endoscopy;  Laterality: N/A;   ESOPHAGOGASTRODUODENOSCOPY (EGD) WITH PROPOFOL  N/A 10/14/2016   Procedure: ESOPHAGOGASTRODUODENOSCOPY (EGD) WITH PROPOFOL ;  Surgeon: Gaylyn Gladis PENNER, MD;   Location: [facility] ENDOSCOPY;  Service: Endoscopy;  Laterality: N/A;   GOITER SURGERY     INSERTION PACEMAKER/DEFIB     KIDNEY STONE SURGERY     TUBAL LIGATION     Social History:  reports that she has never smoked. She has never used smokeless tobacco. She reports that she does not drink alcohol and does not use drugs. Family History:  Family History  Problem Relation Age of Onset   Breast cancer Neg Hx      HOME MEDICATIONS: Allergies as of 02/02/2024       Reactions   Hydrocodone-acetaminophen  Nausea Only   Sulfa Antibiotics Other (See Comments)   UNKNOWN        Medication List        Accurate as of February 02, 2024 10:14 AM. If you have any questions, ask your nurse or doctor.          amiodarone 200 MG tablet Commonly known as: PACERONE Take 0.5 tablets by mouth every other day.   aspirin EC 81 MG tablet Take 81 mg by mouth daily.   atorvastatin 20 MG tablet Commonly known as: LIPITOR Take 20 mg by mouth daily.   Calcium  High Potency/Vitamin D  600-5 MG-MCG Tabs Generic drug: Calcium  Carb-Cholecalciferol Take by mouth.   Culturelle Caps Take 1 capsule by mouth daily.   DULoxetine 30 MG capsule Commonly known as: CYMBALTA Take 30 mg by mouth daily.   furosemide 20 MG tablet Commonly known as: LASIX Take 40 mg by mouth daily.   hydrocortisone  10 MG tablet Commonly known as: CORTEF  1.5 tabs QAM and half a tablet in the afternoon   Insulin  Pen Needle 32G X 4 MM Misc 1 Device by Does not apply route daily.   Lantus  SoloStar 100 UNIT/ML Solostar Pen Generic drug: insulin  glargine Inject 50 Units into the skin daily.   Magnesium  Chloride 70 MG Tbec Take 1 tablet (70 mg total) by mouth 3 (three) times daily.   meloxicam 7.5 MG tablet Commonly known as: MOBIC Take 7.5 mg by mouth daily.   metFORMIN  500 MG 24 hr tablet Commonly known as: GLUCOPHAGE -XR TAKE 1 TABLET BY MOUTH TWICE A DAY   metoprolol tartrate 25 MG tablet Commonly known as:  LOPRESSOR Take 12.5 mg by mouth 2 (two) times daily.   RABEprazole 20 MG tablet Commonly known as: ACIPHEX Take 20 mg by mouth daily.   repaglinide  0.5 MG tablet Commonly known as: PRANDIN  Take 1 tablet (0.5 mg total) by mouth 2 (two) times daily before a meal.   Slow Magnesium /Calcium  70-117 MG Tbec Generic drug: Magnesium  Cl-Calcium  Carbonate Take 1 tablet by mouth 3 (three) times daily.   spironolactone 25 MG tablet Commonly known as: ALDACTONE Take 25 mg by mouth daily.   Vitamin D  (Ergocalciferol ) 1.25 MG (50000 UNIT) Caps capsule Commonly known as: DRISDOL  Take 1 capsule (50,000 Units total) by mouth every 7 (seven) days.          OBJECTIVE:  PHYSICAL EXAM: VS: BP 120/70 (BP Location: Right Arm, Patient Position: Sitting, Cuff Size: Normal)   Pulse (!) 56   Ht 5' 6 (1.676 m)   Wt 184 lb (83.5 kg)   SpO2 98%   BMI 29.70 kg/m    EXAM: General: Pt appears well and is in NAD  Lungs: Clear with good BS bilat   Heart: Auscultation: RRR.  Extremities:  BL LE: no pretibial edema   Mental Status: Judgment, insight: Intact Orientation: Oriented to time, place, and person Mood and affect: No depression, anxiety, or agitation   DM Foot Exam 04/26/2023 per  podiatry     DATA REVIEWED:   Latest Reference Range & Units 02/02/24 10:40  Sodium 135 - 146 mmol/L 142  Potassium 3.5 - 5.3 mmol/L 4.6  Chloride 98 - 110 mmol/L 108  CO2 20 - 32 mmol/L 26  Glucose 65 - 99 mg/dL 866 (H)  BUN 7 - 25 mg/dL 24  Creatinine 9.39 - 8.99 mg/dL 8.71 (H)  Calcium  8.6 - 10.4 mg/dL 8.6 - 89.5 mg/dL 9.6 9.6  BUN/Creatinine Ratio 6 - 22 (calc) 19  eGFR > OR = 60 mL/min/1.86m2 43 (L)  Magnesium  1.5 - 2.5 mg/dL 1.4 (L)    Latest Reference Range & Units 02/02/24 10:40  Vitamin D , 25-Hydroxy 30 - 100 ng/mL 106 (H)    Latest Reference Range & Units 02/02/24 10:40  PTH, Intact 16 - 77 pg/mL 17  Albumin MSPROF 3.6 - 5.1 g/dL 4.1     In office BG 871 MGs/DL    ASSESSMENT  / PLAN / RECOMMENDATIONS:   Adrenal Insufficiency    - This was based on hypotension and low serum cortisol . She was on Dexamethasone for 5 months before switching her to [facility] in 07/2020 - Discussed sick day rule -Patient was again advised to obtain medical alert bracelet - ACTH  pending today  Medications : Hydrocortisone  10 mg, 1.5 tabs with Breakfast and half a tablet between 2-4 pm        2. Osteoporosis      - Osteoporosis with fragility fracture  - Was on Fosamax in the past, would like to avoid due to GERD - I have recommended PTH analogs as the best option for osteoporosis and fragility fracture but she declined as daily injections would cause hardship on her. - Prolia  was cost prohibitive and were unable to follow through with the pt assistance.  -She was able to receive her first zoledronic  acid infusion 11/04/2021,received her second dose 02/2023, will proceed with third dose ordering -DXA scan has been ordered -Emphasized importance of optimizing calcium  and vitamin D  intake  Medications : - Continue Calcium  600 mg BID  - Continue Zoledronic  Acid 5 mg IV annually    3.Type 2 Diabetes Mellitus, optimally controlled- Most recent A1c of 7.2 %. Goal A1c < 7.0%.    -A1c trending down - Intolerant to higher doses of Metformin   - SGLT-2  been cost prohibitive  - Not a candidate for GLP-1 agonist due to chronic diarrhea  -I tried to prescribe glipizide  to target postprandial hyperglycemia, per patient the pharmacist was not comfortable dispensing glipizide  due to sulfa allergy. - I prescribed  repaglinide  on her last visit here, but she has not started it stating that she did not remember what the medicine was for - I have encouraged her to start repaglinide  and will decrease Lantus  - A prescription for Dexcom will be faxed to her DME supplier  Medications : Start repaglinide  0.5  mg, 1 tablet before breakfast and supper Continue Metformin  500 mg , 1 tab BID  Decrease Lantus   45 units daily        4. Hypomagnsemia :     -Most likely this is caused by diuretic use  - Historically she has had diarrhea in the past but this has resolved Medication  Continue mag chloride 70 mg two times  daily     5. Vitamin D  deficiency :    -Vitamin D  levels are high, will discontinue ergocalciferol   Stop ergocalciferol  50,000 iu weekly      Follow-up in 6 months   Signed electronically by: Stefano Redgie Butts, MD  [facility]  [facility] [address] [place], [place] [address] Phone: [phone] FAX: [phone]      CC: Montey Rankin RIGGERS [address] [place] [address] Phone: [phone]  Fax: [phone]   Return to Endocrinology clinic as below: Future Appointments  Date Time Provider Department Center  02/02/2024 10:30 AM Oda Placke, Donell Redgie, MD [id number] None

## 2024-02-02 NOTE — Telephone Encounter (Signed)
 Dr. Sam, patient will be scheduled as soon as possible.  Auth Submission: NO AUTH NEEDED Site of care: Site of care: [facility] Payer: [employer] medicare Medication & CPT/J Code(s) submitted: Reclast  (Zolendronic acid) [id number] Diagnosis Code:  Route of submission (phone, fax, portal): portal Phone # Fax # Auth type: Buy/Bill PB Units/visits requested: 5mg  x 1 dose Reference number:  Approval from: 02/02/24 to 06/01/24

## 2024-02-02 NOTE — Patient Instructions (Addendum)
-   Start Repaglinide  0.5 mg, 1 tablet before Breakfast and 1 tablet before Supper - Continue  Metformin  500 mg, 1 tablet with Breakfast and 1 tablet with Supper - Decrease Lantus  45 units daily  -Continue hydrocortisone  10 mg, 1.5 tablets with breakfast and 1 tablet in the afternoon    -HOW TO TREAT LOW BLOOD SUGARS (Blood sugar LESS THAN 70 MG/DL) Please follow the RULE OF 15 for the treatment of hypoglycemia treatment (when your (blood sugars are less than 70 mg/dL)   STEP 1: Take 15 grams of carbohydrates when your blood sugar is low, which includes:  3-4 GLUCOSE TABS  OR 3-4 OZ OF JUICE OR REGULAR SODA OR ONE TUBE OF GLUCOSE GEL    STEP 2: RECHECK blood sugar in 15 MINUTES STEP 3: If your blood sugar is still low at the 15 minute recheck --> then, go back to STEP 1 and treat AGAIN with another 15 grams of carbohydrates.   ADRENAL INSUFFICIENCY SICK DAY RULES:  Should you face an extreme emotional or physical stress such as trauma, surgery or acute illness, this will require extra steroid coverage so that the body can meet that stress.   Without increasing the steroid dose you may experience severe weakness, headache, dizziness, nausea and vomiting and possibly a more serious deterioration in health.  Typically the dose of steroids will only need to be increased for a couple of days if you have an illness that is transient and managed in the community.   If you are unable to take/absorb an increased dose of steroids orally because of vomiting or diarrhea, you will urgently require steroid injections and should present to an Emergency Department.  The general advice for any serious illness is as follows: Double the normal daily steroid dose for up to 3 days if you have a temperature of more than 37.50C (99.73F) with signs of sickness, or severe emotional or physical distress Contact your primary care doctor and Endocrinologist if the illness worsens or it lasts for more than 3 days.   In cases of severe illness, urgent medical assistance should be promptly sought. If you experience vomiting/diarrhea or are unable to take steroids by mouth, please administer the Hydrocortisone  injection kit and seek urgent medical help.

## 2024-02-03 ENCOUNTER — Ambulatory Visit: Payer: Self-pay | Admitting: Internal Medicine

## 2024-02-03 NOTE — Telephone Encounter (Signed)
 Can you please contact the patient and let her know that her vitamin D  is very high and she needs to stop ergocalciferol    Kidney function is stable as well as magnesium  level   Calcium  and parathyroid  hormone are normal as well as her thyroid    Thanks

## 2024-02-05 LAB — BASIC METABOLIC PANEL WITH GFR
BUN/Creatinine Ratio: 19 (calc) (ref 6–22)
BUN: 24 mg/dL (ref 7–25)
CO2: 26 mmol/L (ref 20–32)
Calcium: 9.6 mg/dL (ref 8.6–10.4)
Chloride: 108 mmol/L (ref 98–110)
Creat: 1.28 mg/dL — ABNORMAL HIGH (ref 0.60–1.00)
Glucose, Bld: 133 mg/dL — ABNORMAL HIGH (ref 65–99)
Potassium: 4.6 mmol/L (ref 3.5–5.3)
Sodium: 142 mmol/L (ref 135–146)
eGFR: 43 mL/min/1.73m2 — ABNORMAL LOW (ref >=60)

## 2024-02-05 LAB — MAGNESIUM: Magnesium: 1.4 mg/dL — ABNORMAL LOW (ref 1.5–2.5)

## 2024-02-05 LAB — ACTH: C206 ACTH: <5 pg/mL — ABNORMAL LOW (ref 6–50)

## 2024-02-05 LAB — VITAMIN D 25 HYDROXY (VIT D DEFICIENCY, FRACTURES): Vit D, 25-Hydroxy: 106 ng/mL — ABNORMAL HIGH (ref 30–100)

## 2024-02-05 LAB — PTH, INTACT AND CALCIUM
Calcium: 9.6 mg/dL (ref 8.6–10.4)
PTH: 17 pg/mL (ref 16–77)

## 2024-02-05 LAB — ALBUMIN: Albumin: 4.1 g/dL (ref 3.6–5.1)

## 2024-02-11 DIAGNOSIS — E1165 Type 2 diabetes mellitus with hyperglycemia: Secondary | ICD-10-CM | POA: Diagnosis not present

## 2024-02-18 ENCOUNTER — Ambulatory Visit

## 2024-03-01 ENCOUNTER — Other Ambulatory Visit

## 2024-03-03 ENCOUNTER — Ambulatory Visit: Admitting: *Deleted

## 2024-03-03 VITALS — BP 127/78 | HR 70 | Temp 97.5°F | Resp 16 | Ht 66.0 in | Wt 180.4 lb

## 2024-03-03 DIAGNOSIS — M810 Age-related osteoporosis without current pathological fracture: Secondary | ICD-10-CM

## 2024-03-03 DIAGNOSIS — M8000XS Age-related osteoporosis with current pathological fracture, unspecified site, sequela: Secondary | ICD-10-CM

## 2024-03-03 MED ORDER — DIPHENHYDRAMINE HCL 25 MG PO CAPS
25.0000 mg | ORAL_CAPSULE | Freq: Once | ORAL | Status: AC
  Administered 2024-03-03: 25 mg via ORAL
  Filled 2024-03-03: qty 1

## 2024-03-03 MED ORDER — ACETAMINOPHEN 325 MG PO TABS
650.0000 mg | ORAL_TABLET | Freq: Once | ORAL | Status: AC
  Administered 2024-03-03: 650 mg via ORAL
  Filled 2024-03-03: qty 2

## 2024-03-03 MED ORDER — ZOLEDRONIC ACID 5 MG/100ML IV SOLN
5.0000 mg | Freq: Once | INTRAVENOUS | Status: AC
  Administered 2024-03-03: 5 mg via INTRAVENOUS
  Filled 2024-03-03: qty 100

## 2024-03-03 NOTE — Progress Notes (Signed)
 Diagnosis: Osteoporosis  Provider:  Mannam, Praveen MD  Procedure: IV Infusion  IV Type: Peripheral, IV Location: R Antecubital  Reclast  (Zolendronic Acid), Dose: 5 mg  Infusion Start Time: 1044  Infusion Stop Time: 1115  Post Infusion IV Care: Observation period completed and Peripheral IV Discontinued  Discharge: Condition: Good, Destination: Home . AVS Provided  Performed by:  Mathew Therisa NOVAK, RN

## 2024-03-08 DIAGNOSIS — I10 Essential (primary) hypertension: Secondary | ICD-10-CM | POA: Diagnosis not present

## 2024-03-08 DIAGNOSIS — E7800 Pure hypercholesterolemia, unspecified: Secondary | ICD-10-CM | POA: Diagnosis not present

## 2024-03-08 DIAGNOSIS — I4721 Torsades de pointes: Secondary | ICD-10-CM | POA: Diagnosis not present

## 2024-03-08 DIAGNOSIS — I2510 Atherosclerotic heart disease of native coronary artery without angina pectoris: Secondary | ICD-10-CM | POA: Diagnosis not present

## 2024-03-08 DIAGNOSIS — M810 Age-related osteoporosis without current pathological fracture: Secondary | ICD-10-CM | POA: Diagnosis not present

## 2024-03-08 DIAGNOSIS — F325 Major depressive disorder, single episode, in full remission: Secondary | ICD-10-CM | POA: Diagnosis not present

## 2024-03-08 DIAGNOSIS — M545 Low back pain, unspecified: Secondary | ICD-10-CM | POA: Diagnosis not present

## 2024-03-08 DIAGNOSIS — E1149 Type 2 diabetes mellitus with other diabetic neurological complication: Secondary | ICD-10-CM | POA: Diagnosis not present

## 2024-03-08 DIAGNOSIS — K219 Gastro-esophageal reflux disease without esophagitis: Secondary | ICD-10-CM | POA: Diagnosis not present

## 2024-03-08 DIAGNOSIS — R251 Tremor, unspecified: Secondary | ICD-10-CM | POA: Diagnosis not present

## 2024-03-08 DIAGNOSIS — Z23 Encounter for immunization: Secondary | ICD-10-CM | POA: Diagnosis not present

## 2024-03-15 DIAGNOSIS — I429 Cardiomyopathy, unspecified: Secondary | ICD-10-CM | POA: Diagnosis not present

## 2024-03-15 DIAGNOSIS — Z95810 Presence of automatic (implantable) cardiac defibrillator: Secondary | ICD-10-CM | POA: Diagnosis not present

## 2024-03-22 ENCOUNTER — Ambulatory Visit
Admission: RE | Admit: 2024-03-22 | Discharge: 2024-03-22 | Disposition: A | Discharge status: Home / Self Care | Source: Ambulatory Visit | Attending: Internal Medicine | Admitting: Internal Medicine

## 2024-03-22 DIAGNOSIS — Z79899 Other long term (current) drug therapy: Secondary | ICD-10-CM | POA: Insufficient documentation

## 2024-03-22 DIAGNOSIS — M8000XS Age-related osteoporosis with current pathological fracture, unspecified site, sequela: Secondary | ICD-10-CM | POA: Diagnosis not present

## 2024-03-22 DIAGNOSIS — Z780 Asymptomatic menopausal state: Secondary | ICD-10-CM | POA: Diagnosis not present

## 2024-04-16 ENCOUNTER — Other Ambulatory Visit: Payer: Self-pay | Admitting: Internal Medicine

## 2024-08-09 ENCOUNTER — Ambulatory Visit: Admitting: Internal Medicine
